# Patient Record
Sex: Female | Born: 1985 | Race: White | Hispanic: No | Marital: Married | Smoking: Never smoker
Health system: Southern US, Community
[De-identification: ages and names within clinical notes are randomized; demographics above are authoritative.]

## PROBLEM LIST (undated history)

## (undated) DIAGNOSIS — M6208 Separation of muscle (nontraumatic), other site: Secondary | ICD-10-CM

## (undated) DIAGNOSIS — Z789 Other specified health status: Secondary | ICD-10-CM

## (undated) HISTORY — DX: Other specified health status: Z78.9

## (undated) HISTORY — PX: NO PAST SURGERIES: SHX2092

## (undated) HISTORY — PX: WISDOM TOOTH EXTRACTION: SHX21

## (undated) HISTORY — DX: Separation of muscle (nontraumatic), other site: M62.08

---

## 2018-08-25 ENCOUNTER — Ambulatory Visit (INDEPENDENT_AMBULATORY_CARE_PROVIDER_SITE_OTHER): Payer: BLUE CROSS/BLUE SHIELD

## 2018-08-25 ENCOUNTER — Other Ambulatory Visit: Payer: Self-pay

## 2018-08-25 ENCOUNTER — Ambulatory Visit (HOSPITAL_COMMUNITY)
Admission: EM | Admit: 2018-08-25 | Discharge: 2018-08-25 | Disposition: A | Discharge status: Home / Self Care | Payer: BLUE CROSS/BLUE SHIELD | Attending: Family Medicine | Admitting: Family Medicine

## 2018-08-25 ENCOUNTER — Encounter (HOSPITAL_COMMUNITY): Payer: Self-pay | Admitting: Emergency Medicine

## 2018-08-25 DIAGNOSIS — B9789 Other viral agents as the cause of diseases classified elsewhere: Secondary | ICD-10-CM

## 2018-08-25 DIAGNOSIS — J069 Acute upper respiratory infection, unspecified: Secondary | ICD-10-CM | POA: Diagnosis not present

## 2018-08-25 DIAGNOSIS — R05 Cough: Secondary | ICD-10-CM | POA: Diagnosis not present

## 2018-08-25 DIAGNOSIS — R509 Fever, unspecified: Secondary | ICD-10-CM

## 2018-08-25 IMAGING — DX CHEST - 2 VIEW
2 series · 2 of 2 positions shown · non-contrast
Comparison: None.

CLINICAL DATA: Cough and fever

EXAM:
CHEST - 2 VIEW

[chest pa]
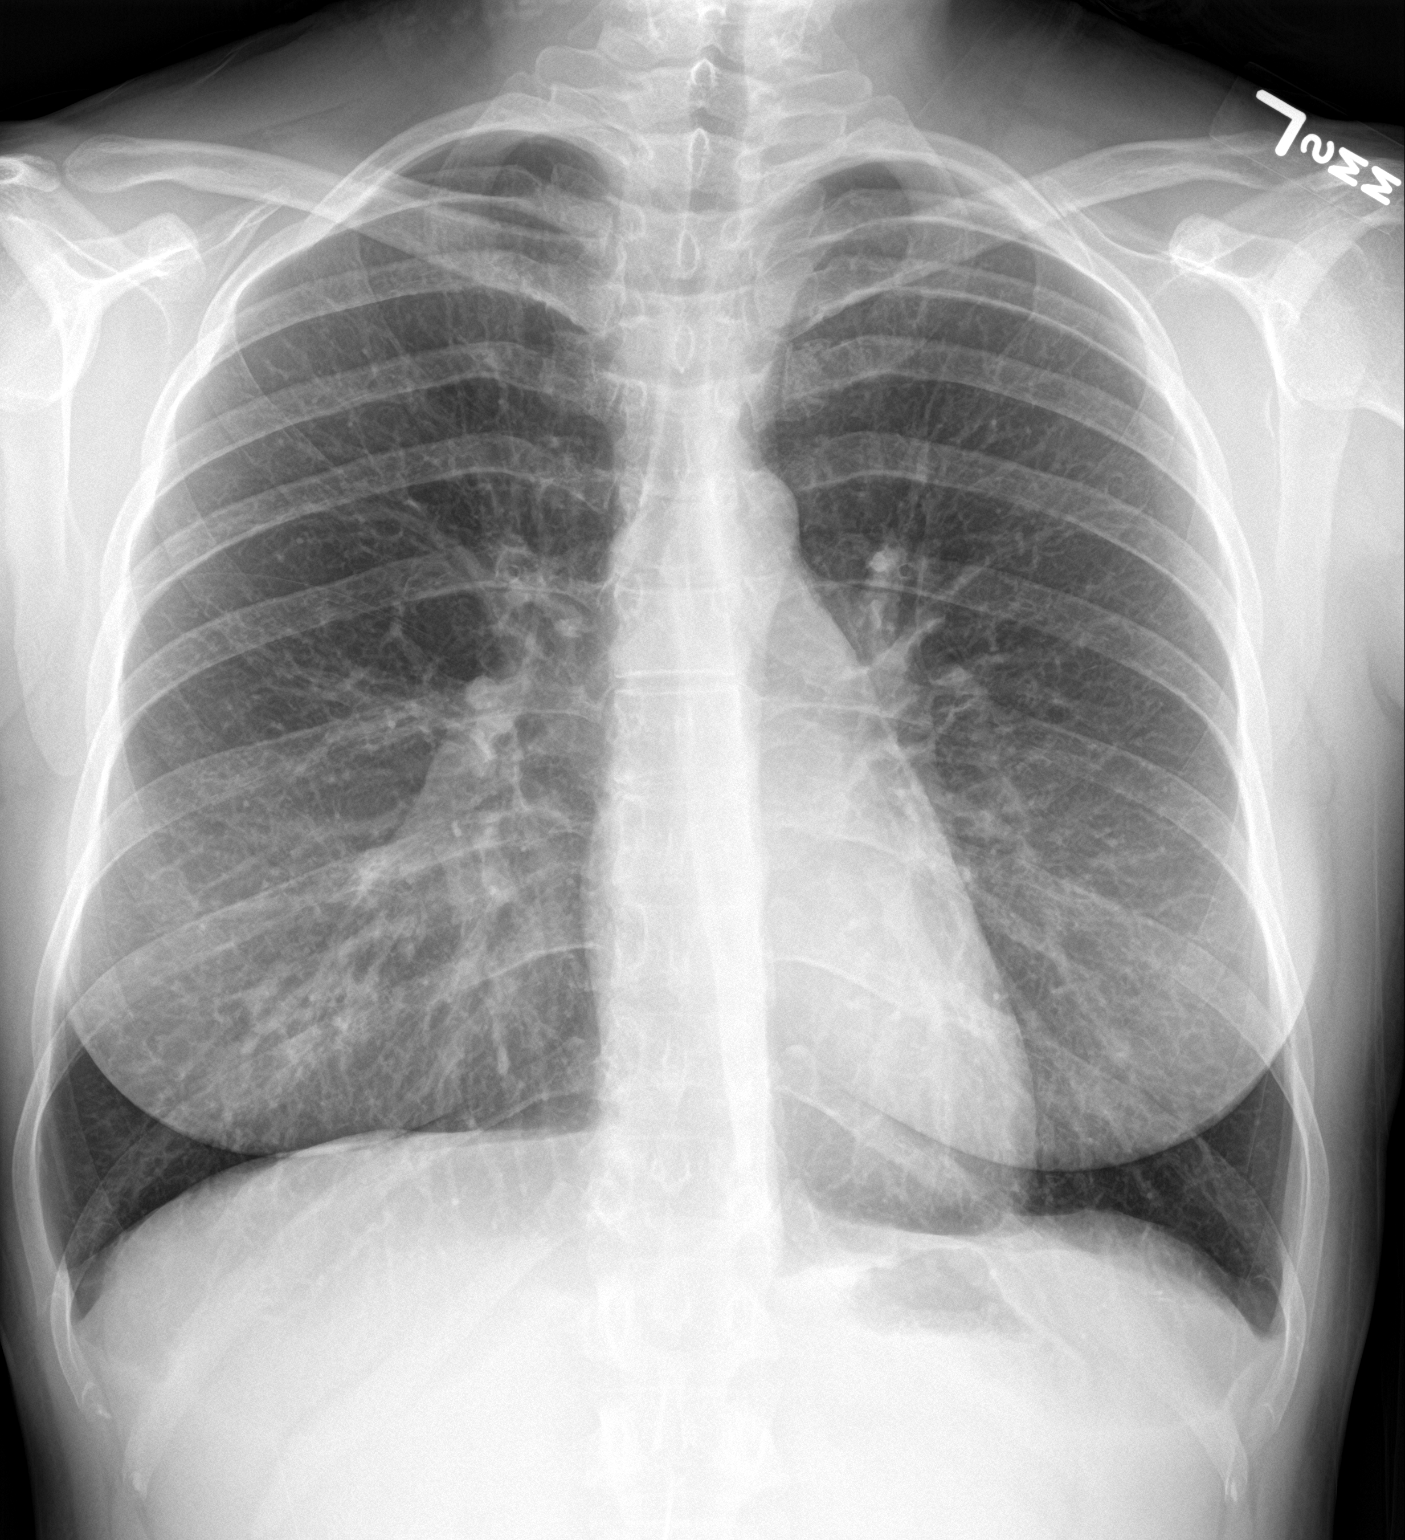

[chest lat]
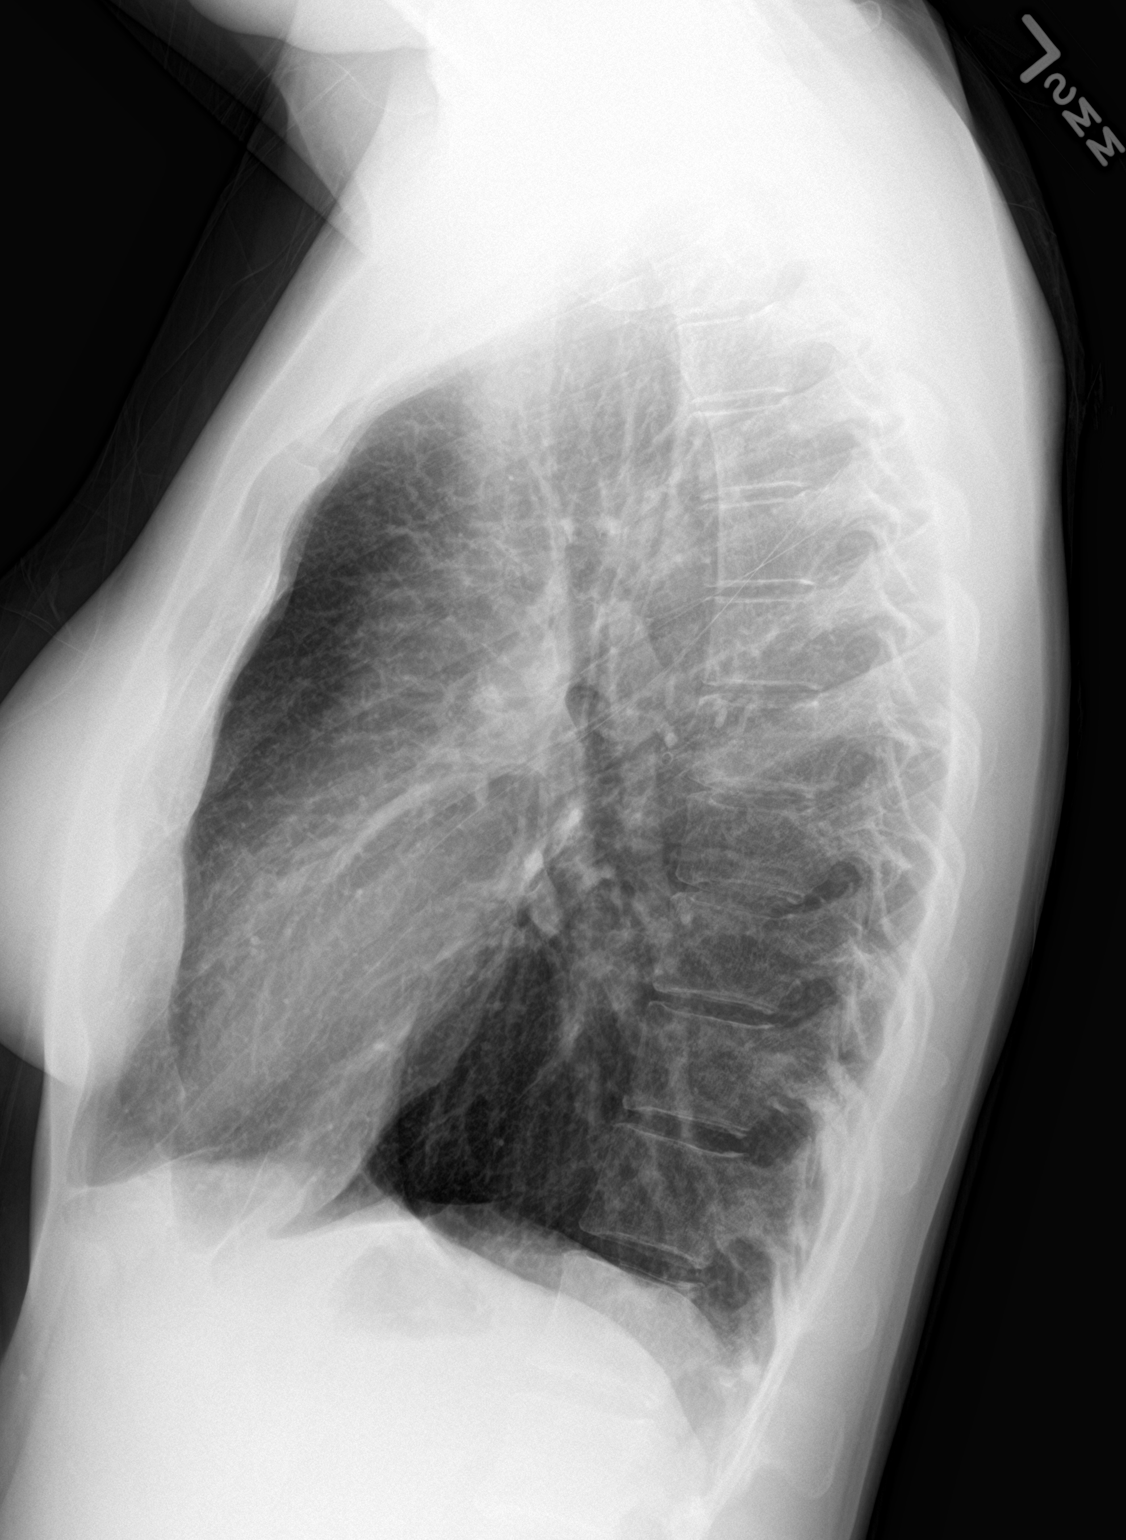

[2 of 2 positions shown; findings below may reference images not displayed]

FINDINGS: Lungs are clear. Heart size and pulmonary vascularity are normal. No
adenopathy. No bone lesions.
IMPRESSION: No edema or consolidation.

## 2018-08-25 MED ORDER — DM-GUAIFENESIN ER 30-600 MG PO TB12: 1.0000 | ORAL_TABLET | Freq: Two times a day (BID) | ORAL | 0 refills | Status: DC

## 2018-08-25 MED ORDER — BENZONATATE 200 MG PO CAPS: 200.0000 mg | ORAL_CAPSULE | Freq: Two times a day (BID) | ORAL | 0 refills | Status: DC | PRN

## 2018-08-25 NOTE — ED Triage Notes (Signed)
3/21 noticed congestion in chest and cough.  Today having coughing episodes.  Patient feels tired.  Patient has rattling in chest

## 2018-08-25 NOTE — ED Notes (Signed)
Patient verbalizes understanding of discharge instructions. Opportunity for questioning and answers were provided. Patient discharged from [facility] by APP.  

## 2018-08-25 NOTE — ED Provider Notes (Signed)
[facility]    CSN: [id number] Arrival date & time: 08/25/18  1236     History   Chief Complaint Chief Complaint  Patient presents with  . Cough    HPI Mary Baldwin is a 33 y.o. female.   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  Because of the coronavirus, she is working from home.  She and her husband came to [place] with their 2 children in order to have access to childcare.  1 of her daughters has had a fever recently with respiratory illness.  Her husband had a respiratory illness and was treated with a penicillin.  Her mother had a respiratory illness and was treated with a Z-Pak.  This is all been within the last 2 to 3 weeks.  She has been in [place] for the last few days.  She has been coughing for 2 days, has a lot of congestion, feels like her lungs are "rattling" with a cough.  No shortness of breath.  Has not noted any sweats chills or fever.  She states she feels very tired. No known exposure to a direct person with coronavirus.  Claris Gower has the highest community transmission in the state of [place].  History reviewed. No pertinent past medical history.  There are no active problems to display for this patient.   History reviewed. No pertinent surgical history.  OB History   No obstetric history on file.      Home Medications    Prior to Admission medications   Medication Sig Start Date End Date Taking? Authorizing Provider  benzonatate (TESSALON) 200 MG capsule Take 1 capsule (200 mg total) by mouth 2 (two) times daily as needed for cough. 08/25/18   Eustace Moore, MD  dextromethorphan-guaiFENesin [facility] DM) 30-600 MG 12hr tablet Take 1 tablet by mouth 2 (two) times daily. 08/25/18   Eustace Moore, MD    Family History Family History  Problem Relation Age of Onset  . Healthy Mother     Social History Social History   Tobacco Use  . Smoking status: Never Smoker  Substance Use Topics   . Alcohol use: Not Currently  . Drug use: Never     Allergies   Patient has no known allergies.   Review of Systems Review of Systems  Constitutional: Positive for fatigue. Negative for chills and fever.  HENT: Negative for congestion, ear pain and sore throat.   Eyes: Negative for pain and visual disturbance.  Respiratory: Positive for cough. Negative for shortness of breath.   Cardiovascular: Negative for chest pain and palpitations.  Gastrointestinal: Negative for abdominal pain and vomiting.  Genitourinary: Negative for dysuria and hematuria.  Musculoskeletal: Negative for arthralgias and back pain.  Skin: Negative for color change and rash.  Neurological: Negative for seizures and syncope.  All other systems reviewed and are negative.    Physical Exam Triage Vital Signs ED Triage Vitals  Enc Vitals Group     BP 08/25/18 1310 131/79     Pulse Rate 08/25/18 1310 (!) 106     Resp 08/25/18 1310 18     Temp 08/25/18 1310 98.4 F (36.9 C)     Temp Source 08/25/18 1310 Oral     SpO2 08/25/18 1310 100 %     Weight --      Height --      Head Circumference --      Peak Flow --  Pain Score 08/25/18 1305 4     Pain Loc --      Pain Edu? --      Excl. in GC? --    No data found.  Updated Vital Signs BP 131/79 (BP Location: Right Arm)   Pulse (!) 106   Temp 98.4 F (36.9 C) (Oral)   Resp 18   LMP 12/24/2016   SpO2 100%     Physical Exam Constitutional:      Appearance: She is well-developed. She is not ill-appearing.  HENT:     Head: Normocephalic and atraumatic.     Right Ear: Tympanic membrane, ear canal and external ear normal.     Left Ear: Tympanic membrane, ear canal and external ear normal.     Nose: Nose normal.     Mouth/Throat:     Mouth: Mucous membranes are moist.     Pharynx: No posterior oropharyngeal erythema.  Eyes:     Conjunctiva/sclera: Conjunctivae normal.     Pupils: Pupils are equal, round, and reactive to light.  Neck:      Musculoskeletal: Normal range of motion and neck supple.  Cardiovascular:     Rate and Rhythm: Normal rate and regular rhythm.     Heart sounds: Normal heart sounds.  Pulmonary:     Effort: Pulmonary effort is normal. No respiratory distress.     Breath sounds: Rhonchi present.     Comments: Course rhonchi throughout both lung fields, right greater than left Abdominal:     General: There is no distension.     Palpations: Abdomen is soft.  Musculoskeletal: Normal range of motion.  Skin:    General: Skin is warm and dry.  Neurological:     General: No focal deficit present.     Mental Status: She is alert and oriented to person, place, and time.  Psychiatric:        Mood and Affect: Mood normal.        Behavior: Behavior normal.      UC Treatments / Results  Labs (all labs ordered are listed, but only abnormal results are displayed) Labs Reviewed - No data to display  EKG None  Radiology Dg Chest 2 View  Result Date: 08/25/2018 CLINICAL DATA:  Cough and fever EXAM: CHEST - 2 VIEW COMPARISON:  None. FINDINGS: Lungs are clear. Heart size and pulmonary vascularity are normal. No adenopathy. No bone lesions. IMPRESSION: No edema or consolidation. Electronically Signed   By: Bretta BangWilliam  Woodruff III M.D.   On: 08/25/2018 14:51    Procedures Procedures (including critical care time)  Medications Ordered in UC Medications - No data to display  Initial Impression / Assessment and Plan / UC Course  I have reviewed the triage vital signs and the nursing notes.  Pertinent labs & imaging results that were available during my care of the patient were reviewed by me and considered in my medical decision making (see chart for details).    I had a discussion with the patient that there is no way to determine whether she has the novel coronavirus.  The safest approach is to assume she does have, and to quarantine.  She is breast-feeding her 3326-month-old infant.  I told her not to stop to  be sure she practices good handwashing and wears a mask while feeding the baby.  Permission was given to the patient from the [facility] [place] [facility] and [employer].  Final Clinical Impressions(s) / UC Diagnoses   Final diagnoses:  Viral URI with cough     Discharge Instructions     Drink plenty of fluids Use humidifier if you have one Take both Mucinex DM and Tessalon every 12 hours for cough In between doses you can try a spoonful of honey  Testing is not available for the coronavirus unless a person is critically ill or in the hospital.  There exists the possibility that you could have coronavirus at this time.  Anne Shutter is recommended until you are symptom-free.  Please read information given to you    ED Prescriptions    Medication Sig Dispense Auth. Provider   benzonatate (TESSALON) 200 MG capsule Take 1 capsule (200 mg total) by mouth 2 (two) times daily as needed for cough. 20 capsule Eustace Moore, MD   dextromethorphan-guaiFENesin [facility] DM) 30-600 MG 12hr tablet Take 1 tablet by mouth 2 (two) times daily. 20 tablet Eustace Moore, MD     Controlled Substance Prescriptions [place] Controlled Substance Registry consulted? No   Eustace Moore, MD 08/25/18 [phone]

## 2018-08-25 NOTE — Discharge Instructions (Addendum)
Drink plenty of fluids Use humidifier if you have one Take both Mucinex DM and Tessalon every 12 hours for cough In between doses you can try a spoonful of honey  Testing is not available for the coronavirus unless a person is critically ill or in the hospital.  There exists the possibility that you could have coronavirus at this time.  Anne Shutter is recommended until you are symptom-free.  Please read information given to you

## 2020-02-16 LAB — OB RESULTS CONSOLE HIV ANTIBODY (ROUTINE TESTING): HIV: NONREACTIVE

## 2020-02-16 LAB — OB RESULTS CONSOLE HEPATITIS B SURFACE ANTIGEN: Hepatitis B Surface Ag: NEGATIVE

## 2020-02-16 LAB — OB RESULTS CONSOLE RPR: RPR: NONREACTIVE

## 2020-02-16 LAB — OB RESULTS CONSOLE GC/CHLAMYDIA
Chlamydia: NEGATIVE
Gonorrhea: NEGATIVE

## 2020-02-16 LAB — OB RESULTS CONSOLE RUBELLA ANTIBODY, IGM: Rubella: IMMUNE

## 2020-03-07 ENCOUNTER — Other Ambulatory Visit: Payer: BLUE CROSS/BLUE SHIELD

## 2020-03-07 DIAGNOSIS — Z20822 Contact with and (suspected) exposure to covid-19: Secondary | ICD-10-CM

## 2020-03-08 LAB — SARS-COV-2, NAA 2 DAY TAT

## 2020-03-08 LAB — NOVEL CORONAVIRUS, NAA: SARS-CoV-2, NAA: NOT DETECTED

## 2020-03-09 ENCOUNTER — Telehealth: Payer: Self-pay

## 2020-03-09 NOTE — Telephone Encounter (Signed)
Naleah called to get COVID results.  I made her aware result is not detected/negative.  Lenda Kelp, RN

## 2020-03-17 DIAGNOSIS — H6123 Impacted cerumen, bilateral: Secondary | ICD-10-CM | POA: Insufficient documentation

## 2020-06-04 NOTE — L&D Delivery Note (Signed)
Delivery Note:   G3P1001 at [redacted]w[redacted]d  Admitting diagnosis: Pregnancy [Z34.90] Risks: Polyhydramnios Onset of labor: 09/09/2020 0830 IOL/Augmentation: AROM, Pitocin and Cytotec ROM: 09/09/2020 1607, clear fluid, copious amount  Complete dilation at 09/09/2020  2234 Onset of pushing at 2235 FHR second stage Cat I  Analgesia /Anesthesia intrapartum:None  Pushing in right side lying position with CNM and L&D staff support, husband, Lafayette Dragon, present for birth and supportive.  Delivery of a Live born female  Birth Weight:  3581g, 7lb 14.3oz APGAR: 9, 9  Newborn Delivery   Birth date/time: 09/09/2020 22:45:00 Delivery type: Vaginal, Spontaneous      in cephalic presentation, position OA to LOA.  APGAR:1 min-9 , 5 min-9   Nuchal Cord: No  Cord double clamped after cessation of pulsation, cut by Lafayette Dragon.  Collection of cord blood for typing completed. Cord blood donation-None  Arterial cord blood sample-No    Placenta delivered-Spontaneous  with 3 vessels . Uterotonics: Pitocin Placenta to L&D Uterine tone firm  Bleeding stable  Initially brisk with placenta delivery. Pitocin started and fundal massage performed. Firm and bleeding small.   None  laceration identified.  Episiotomy:None  Local analgesia: N/A  Repair: N/A Est. Blood Loss (mL):600.00   Complications: None  Mom to postpartum.  Baby Cornelius [place] to Couplet care / Skin to Skin.  Delivery Report:  Review the Delivery Report for details.    June Leap, CNM, MSN 09/09/2020, 11:48 PM

## 2020-08-29 ENCOUNTER — Telehealth (HOSPITAL_COMMUNITY): Payer: Self-pay | Admitting: *Deleted

## 2020-08-29 ENCOUNTER — Encounter (HOSPITAL_COMMUNITY): Payer: Self-pay | Admitting: *Deleted

## 2020-08-29 LAB — OB RESULTS CONSOLE GBS: GBS: NEGATIVE

## 2020-08-29 NOTE — Telephone Encounter (Signed)
Preadmission screen  

## 2020-09-02 ENCOUNTER — Encounter (HOSPITAL_COMMUNITY): Payer: Self-pay | Admitting: Obstetrics

## 2020-09-02 ENCOUNTER — Other Ambulatory Visit: Payer: Self-pay

## 2020-09-02 ENCOUNTER — Inpatient Hospital Stay (HOSPITAL_BASED_OUTPATIENT_CLINIC_OR_DEPARTMENT_OTHER): Payer: BLUE CROSS/BLUE SHIELD

## 2020-09-02 ENCOUNTER — Encounter (HOSPITAL_COMMUNITY): Payer: Self-pay | Admitting: *Deleted

## 2020-09-02 ENCOUNTER — Inpatient Hospital Stay (HOSPITAL_COMMUNITY)
Admission: AD | Admit: 2020-09-02 | Discharge: 2020-09-02 | Disposition: A | Discharge status: Home / Self Care | Payer: BLUE CROSS/BLUE SHIELD | Attending: Obstetrics | Admitting: Obstetrics

## 2020-09-02 ENCOUNTER — Telehealth (HOSPITAL_COMMUNITY): Payer: Self-pay | Admitting: *Deleted

## 2020-09-02 DIAGNOSIS — Z3A38 38 weeks gestation of pregnancy: Secondary | ICD-10-CM | POA: Diagnosis not present

## 2020-09-02 DIAGNOSIS — O9A213 Injury, poisoning and certain other consequences of external causes complicating pregnancy, third trimester: Secondary | ICD-10-CM

## 2020-09-02 DIAGNOSIS — Z9181 History of falling: Secondary | ICD-10-CM

## 2020-09-02 DIAGNOSIS — T1490XA Injury, unspecified, initial encounter: Secondary | ICD-10-CM

## 2020-09-02 DIAGNOSIS — O99891 Other specified diseases and conditions complicating pregnancy: Secondary | ICD-10-CM | POA: Diagnosis not present

## 2020-09-02 DIAGNOSIS — W010XXA Fall on same level from slipping, tripping and stumbling without subsequent striking against object, initial encounter: Secondary | ICD-10-CM | POA: Diagnosis not present

## 2020-09-02 DIAGNOSIS — O26893 Other specified pregnancy related conditions, third trimester: Secondary | ICD-10-CM | POA: Diagnosis present

## 2020-09-02 DIAGNOSIS — Z3689 Encounter for other specified antenatal screening: Secondary | ICD-10-CM

## 2020-09-02 DIAGNOSIS — W19XXXA Unspecified fall, initial encounter: Secondary | ICD-10-CM | POA: Diagnosis not present

## 2020-09-02 LAB — URINALYSIS, ROUTINE W REFLEX MICROSCOPIC
Bilirubin Urine: NEGATIVE
Glucose, UA: NEGATIVE mg/dL
Hgb urine dipstick: NEGATIVE
Ketones, ur: 5 mg/dL — AB
Leukocytes,Ua: NEGATIVE
Nitrite: NEGATIVE
Protein, ur: NEGATIVE mg/dL
Specific Gravity, Urine: 1.005 (ref 1.005–1.030)
pH: 7 (ref 5.0–8.0)

## 2020-09-02 NOTE — Discharge Instructions (Signed)
Fall Prevention in the Home, Adult Falls can cause injuries and can affect people from all age groups. There are many simple things that you can do to make your home safe and to help prevent falls. Ask for help when making these changes, if needed. What actions can I take to prevent falls? General instructions  Use good lighting in all rooms. Replace any light bulbs that burn out.  Turn on lights if it is dark. Use night-lights.  Place frequently used items in easy-to-reach places. Lower the shelves around your home if necessary.  Set up furniture so that there are clear paths around it. Avoid moving your furniture around.  Remove throw rugs and other tripping hazards from the floor.  Avoid walking on wet floors.  Fix any uneven floor surfaces.  Add color or contrast paint or tape to grab bars and handrails in your home. Place contrasting color strips on the first and last steps of stairways.  When you use a stepladder, make sure that it is completely opened and that the sides are firmly locked. Have someone hold the ladder while you are using it. Do not climb a closed stepladder.  Be aware of any and all pets. What can I do in the bathroom?  Keep the floor dry. Immediately clean up any water that spills onto the floor.  Remove soap buildup in the tub or shower on a regular basis.  Use non-skid mats or decals on the floor of the tub or shower.  Attach bath mats securely with double-sided, non-slip rug tape.  If you need to sit down while you are in the shower, use a plastic, non-slip stool.  Install grab bars by the toilet and in the tub and shower. Do not use towel bars as grab bars.      What can I do in the bedroom?  Make sure that a bedside light is easy to reach.  Do not use oversized bedding that drapes onto the floor.  Have a firm chair that has side arms to use for getting dressed. What can I do in the kitchen?  Clean up any spills right away.  If you need to  reach for something above you, use a sturdy step stool that has a grab bar.  Keep electrical cables out of the way.  Do not use floor polish or wax that makes floors slippery. If you must use wax, make sure that it is non-skid floor wax. What can I do in the stairways?  Do not leave any items on the stairs.  Make sure that you have a light switch at the top of the stairs and the bottom of the stairs. Have them installed if you do not have them.  Make sure that there are handrails on both sides of the stairs. Fix handrails that are broken or loose. Make sure that handrails are as long as the stairways.  Install non-slip stair treads on all stairs in your home.  Avoid having throw rugs at the top or bottom of stairways, or secure the rugs with carpet tape to prevent them from moving.  Choose a carpet design that does not hide the edge of steps on the stairway.  Check any carpeting to make sure that it is firmly attached to the stairs. Fix any carpet that is loose or worn. What can I do on the outside of my home?  Use bright outdoor lighting.  Regularly repair the edges of walkways and driveways and fix any cracks.  Remove high doorway thresholds.  Trim any shrubbery on the main path into your home.  Regularly check that handrails are securely fastened and in good repair. Both sides of any steps should have handrails.  Install guardrails along the edges of any raised decks or porches.  Clear walkways of debris and clutter, including tools and rocks.  Have leaves, snow, and ice cleared regularly.  Use sand or salt on walkways during winter months.  In the garage, clean up any spills right away, including grease or oil spills. What other actions can I take?  Wear closed-toe shoes that fit well and support your feet. Wear shoes that have rubber soles or low heels.  Use mobility aids as needed, such as canes, walkers, scooters, and crutches.  Review your medicines with your  health care provider. Some medicines can cause dizziness or changes in blood pressure, which increase your risk of falling. Talk with your health care provider about other ways that you can decrease your risk of falls. This may include working with a physical therapist or trainer to improve your strength, balance, and endurance. Where to find more information  Centers for Disease Control and Prevention, STEADI: TVDivision.uy  [employer] on Aging: RingConnections.si Contact a health care provider if:  You are afraid of falling at home.  You feel weak, drowsy, or dizzy at home.  You fall at home. Summary  There are many simple things that you can do to make your home safe and to help prevent falls.  Ways to make your home safe include removing tripping hazards and installing grab bars in the bathroom.  Ask for help when making these changes in your home. This information is not intended to replace advice given to you by your health care provider. Make sure you discuss any questions you have with your health care provider. Document Revised: 05/03/2017 Document Reviewed: 01/03/2017 Elsevier Patient Education  2021 Elsevier Inc.  Fetal Movement Counts Patient Name: ________________________________________________ Patient Due Date: ____________________  What is a fetal movement count? A fetal movement count is the number of times that you feel your baby move during a certain amount of time. This may also be called a fetal kick count. A fetal movement count is recommended for every pregnant woman. You may be asked to start counting fetal movements as early as week 28 of your pregnancy. Pay attention to when your baby is most active. You may notice your baby's sleep and wake cycles. You may also notice things that make your baby move more. You should do a fetal movement count:  When your baby is normally most active.  At the same time each day. A good time to count  movements is while you are resting, after having something to eat and drink. How do I count fetal movements? 1. Find a quiet, comfortable area. Sit, or lie down on your side. 2. Write down the date, the start time and stop time, and the number of movements that you felt between those two times. Take this information with you to your health care visits. 3. Write down your start time when you feel the first movement. 4. Count kicks, flutters, swishes, rolls, and jabs. You should feel at least 10 movements. 5. You may stop counting after you have felt 10 movements, or if you have been counting for 2 hours. Write down the stop time. 6. If you do not feel 10 movements in 2 hours, contact your health care provider for further instructions. Your health  care provider may want to do additional tests to assess your baby's well-being. Contact a health care provider if:  You feel fewer than 10 movements in 2 hours.  Your baby is not moving like he or she usually does. Date: ____________ Start time: ____________ Stop time: ____________ Movements: ____________ Date: ____________ Start time: ____________ Stop time: ____________ Movements: ____________ Date: ____________ Start time: ____________ Stop time: ____________ Movements: ____________ Date: ____________ Start time: ____________ Stop time: ____________ Movements: ____________ Date: ____________ Start time: ____________ Stop time: ____________ Movements: ____________ Date: ____________ Start time: ____________ Stop time: ____________ Movements: ____________ Date: ____________ Start time: ____________ Stop time: ____________ Movements: ____________ Date: ____________ Start time: ____________ Stop time: ____________ Movements: ____________ Date: ____________ Start time: ____________ Stop time: ____________ Movements: ____________ This information is not intended to replace advice given to you by your health care provider. Make sure you discuss any  questions you have with your health care provider. Document Revised: 01/08/2019 Document Reviewed: 01/08/2019 Elsevier Patient Education  2021 [employer].

## 2020-09-02 NOTE — Telephone Encounter (Signed)
Preadmission screen  

## 2020-09-02 NOTE — MAU Provider Note (Signed)
S Ms. Mary Baldwin is a 35 y.o. G2P1 pregnant female at [redacted]w[redacted]d who presents to MAU today after a fall at 76. She was holding her older child, tripped and fell to the ground, hitting her knees first then elbow and wrist, then her belly but states she did not hit her belly hard as her knees took the brunt of the fall. Initially had some cramping which has resolved, denies vaginal bleeding. Having BH that are consistent with what she's had every night for a few weeks. Rates them as tight but not painful.   O BP 115/65 (BP Location: Left Arm)   Pulse 72   Temp 98.2 F (36.8 C) (Oral)   Resp 16   Ht 5\' 5"  (1.651 m)   Wt 175 lb 4.8 oz (79.5 kg)   SpO2 100%   BMI 29.17 kg/m  Physical Exam Vitals and nursing note reviewed.  Constitutional:      General: She is not in acute distress.    Appearance: Normal appearance. She is not ill-appearing.  HENT:     Head: Normocephalic and atraumatic.     Mouth/Throat:     Mouth: Mucous membranes are moist.  Eyes:     Pupils: Pupils are equal, round, and reactive to light.  Cardiovascular:     Rate and Rhythm: Normal rate and regular rhythm.     Pulses: Normal pulses.  Pulmonary:     Effort: Pulmonary effort is normal.  Abdominal:     General: There is no distension.     Palpations: Abdomen is soft. There is no mass.     Tenderness: There is no abdominal tenderness. There is no guarding.  Genitourinary:    Comments: Pt declined cervical exam Musculoskeletal:        General: Signs of injury (abrasion to left elbow and right wrist) present. Normal range of motion.     Cervical back: Normal range of motion.  Skin:    General: Skin is warm and dry.     Capillary Refill: Capillary refill takes less than 2 seconds.  Neurological:     Mental Status: She is alert and oriented to person, place, and time.  Psychiatric:        Mood and Affect: Mood normal.        Behavior: Behavior normal.        Thought Content: Thought content normal.         Judgment: Judgment normal.   Fetal Tracing: reactive Baseline: 135 Variability: moderate Accelerations: 15x15 Decelerations: none (initially had a couple of variables, none since beginning of MAU observation) Toco: q2-69min pt continued to rate them as mild tightness, not cramping    Reviewed presentation and findings with Drs. [facility] attending) and [facility]. [facility]) who agreed 4hrs post-fall is enough monitoring given her absence of pain or bleeding + reactive NST.  A Recent history of fall NST reactive  P Discharge from MAU in stable condition with term labor precautions Follow up as scheduled at [facility] for ongoing prenatal care  [facility], CNM 09/02/2020 9:21 PM

## 2020-09-02 NOTE — MAU Note (Signed)
Shantrell Placzek is a 35 y.o. at [redacted]w[redacted]d here in MAU reporting: states she fell today when she was picking her daughter up at school. Pt reports she hit her arm and her abdomen. Fall occurred around 1715. Had some cramping initially but that has gone away. No bleeding or LOF. +FM  Onset of complaint: today  Pain score: 0/10  Vitals:   09/02/20 1814 09/02/20 1825  BP: 123/76 123/73  Pulse: 80 74  Resp: 16   Temp: 98.2 F (36.8 C)   SpO2: 100% 99%     FHT: FHT dopplered while in triage, ? decel heard on doppler so pt brought directly to room 126 where EFM was applied, FHT 137 when applied  Lab orders placed from triage: none

## 2020-09-05 ENCOUNTER — Encounter (HOSPITAL_COMMUNITY): Payer: Self-pay | Admitting: Obstetrics

## 2020-09-07 ENCOUNTER — Other Ambulatory Visit (HOSPITAL_COMMUNITY): Admission: RE | Admit: 2020-09-07 | Payer: BLUE CROSS/BLUE SHIELD | Source: Ambulatory Visit

## 2020-09-07 ENCOUNTER — Other Ambulatory Visit (HOSPITAL_COMMUNITY)
Admission: RE | Admit: 2020-09-07 | Discharge: 2020-09-07 | Disposition: A | Discharge status: Home / Self Care | Payer: BLUE CROSS/BLUE SHIELD | Source: Ambulatory Visit | Attending: Obstetrics | Admitting: Obstetrics

## 2020-09-07 DIAGNOSIS — Z20822 Contact with and (suspected) exposure to covid-19: Secondary | ICD-10-CM | POA: Insufficient documentation

## 2020-09-07 DIAGNOSIS — Z01812 Encounter for preprocedural laboratory examination: Secondary | ICD-10-CM | POA: Insufficient documentation

## 2020-09-07 LAB — SARS CORONAVIRUS 2 (TAT 6-24 HRS): SARS Coronavirus 2: NEGATIVE

## 2020-09-08 ENCOUNTER — Other Ambulatory Visit: Payer: Self-pay | Admitting: Certified Nurse Midwife

## 2020-09-09 ENCOUNTER — Inpatient Hospital Stay (HOSPITAL_COMMUNITY)
Admission: AD | Admit: 2020-09-09 | Discharge: 2020-09-11 | DRG: 807 | Disposition: A | Discharge status: Home / Self Care | Payer: BLUE CROSS/BLUE SHIELD | Attending: Obstetrics and Gynecology | Admitting: Obstetrics and Gynecology

## 2020-09-09 ENCOUNTER — Inpatient Hospital Stay (HOSPITAL_COMMUNITY): Payer: BLUE CROSS/BLUE SHIELD

## 2020-09-09 ENCOUNTER — Inpatient Hospital Stay (HOSPITAL_COMMUNITY): Admission: AD | Admit: 2020-09-09 | Payer: BLUE CROSS/BLUE SHIELD | Source: Home / Self Care | Admitting: Obstetrics

## 2020-09-09 ENCOUNTER — Encounter (HOSPITAL_COMMUNITY): Payer: Self-pay | Admitting: Obstetrics

## 2020-09-09 ENCOUNTER — Other Ambulatory Visit: Payer: Self-pay

## 2020-09-09 DIAGNOSIS — M6208 Separation of muscle (nontraumatic), other site: Secondary | ICD-10-CM | POA: Diagnosis present

## 2020-09-09 DIAGNOSIS — Z20822 Contact with and (suspected) exposure to covid-19: Secondary | ICD-10-CM | POA: Diagnosis present

## 2020-09-09 DIAGNOSIS — O403XX0 Polyhydramnios, third trimester, not applicable or unspecified: Principal | ICD-10-CM | POA: Diagnosis present

## 2020-09-09 DIAGNOSIS — Z3A39 39 weeks gestation of pregnancy: Secondary | ICD-10-CM

## 2020-09-09 DIAGNOSIS — O409XX0 Polyhydramnios, unspecified trimester, not applicable or unspecified: Secondary | ICD-10-CM | POA: Diagnosis present

## 2020-09-09 DIAGNOSIS — Z3490 Encounter for supervision of normal pregnancy, unspecified, unspecified trimester: Secondary | ICD-10-CM

## 2020-09-09 LAB — CBC
HCT: 38.5 % (ref 36.0–46.0)
Hemoglobin: 13.1 g/dL (ref 12.0–15.0)
MCH: 32.6 pg (ref 26.0–34.0)
MCHC: 34 g/dL (ref 30.0–36.0)
MCV: 95.8 fL (ref 80.0–100.0)
Platelets: 134 10*3/uL — ABNORMAL LOW (ref 150–400)
RBC: 4.02 MIL/uL (ref 3.87–5.11)
RDW: 13 % (ref 11.5–15.5)
WBC: 8.1 10*3/uL (ref 4.0–10.5)
nRBC: 0 % (ref 0.0–0.2)

## 2020-09-09 LAB — RPR: RPR Ser Ql: NONREACTIVE

## 2020-09-09 LAB — TYPE AND SCREEN
ABO/RH(D): A POS
Antibody Screen: NEGATIVE

## 2020-09-09 MED ORDER — MISOPROSTOL 50MCG HALF TABLET
50.0000 ug | ORAL_TABLET | ORAL | Status: DC
  Administered 2020-09-09: 50 ug via BUCCAL

## 2020-09-09 MED ORDER — LIDOCAINE HCL (PF) 1 % IJ SOLN: 30.0000 mL | INTRAMUSCULAR | Status: DC | PRN

## 2020-09-09 MED ORDER — OXYTOCIN-SODIUM CHLORIDE 30-0.9 UT/500ML-% IV SOLN
1.0000 m[IU]/min | INTRAVENOUS | Status: DC
  Administered 2020-09-09: 2 m[IU]/min via INTRAVENOUS

## 2020-09-09 MED ORDER — OXYCODONE-ACETAMINOPHEN 5-325 MG PO TABS: 2.0000 | ORAL_TABLET | ORAL | Status: DC | PRN

## 2020-09-09 MED ORDER — OXYTOCIN-SODIUM CHLORIDE 30-0.9 UT/500ML-% IV SOLN
2.5000 [IU]/h | INTRAVENOUS | Status: DC
  Filled 2020-09-09: qty 500

## 2020-09-09 MED ORDER — MISOPROSTOL 50MCG HALF TABLET
ORAL_TABLET | ORAL | Status: AC
  Filled 2020-09-09: qty 1

## 2020-09-09 MED ORDER — OXYCODONE-ACETAMINOPHEN 5-325 MG PO TABS
1.0000 | ORAL_TABLET | ORAL | Status: DC | PRN
Start: 2020-09-09 — End: 2020-09-10

## 2020-09-09 MED ORDER — SOD CITRATE-CITRIC ACID 500-334 MG/5ML PO SOLN: 30.0000 mL | ORAL | Status: DC | PRN

## 2020-09-09 MED ORDER — MISOPROSTOL 50MCG HALF TABLET
50.0000 ug | ORAL_TABLET | ORAL | Status: DC | PRN
  Administered 2020-09-09: 50 ug via BUCCAL
  Filled 2020-09-09: qty 1

## 2020-09-09 MED ORDER — LACTATED RINGERS IV SOLN: INTRAVENOUS | Status: DC

## 2020-09-09 MED ORDER — ONDANSETRON HCL 4 MG/2ML IJ SOLN: 4.0000 mg | Freq: Four times a day (QID) | INTRAMUSCULAR | Status: DC | PRN

## 2020-09-09 MED ORDER — TERBUTALINE SULFATE 1 MG/ML IJ SOLN: 0.2500 mg | Freq: Once | INTRAMUSCULAR | Status: DC | PRN

## 2020-09-09 MED ORDER — ACETAMINOPHEN 325 MG PO TABS: 650.0000 mg | ORAL_TABLET | ORAL | Status: DC | PRN

## 2020-09-09 MED ORDER — LACTATED RINGERS IV SOLN
500.0000 mL | INTRAVENOUS | Status: DC | PRN
Start: 2020-09-09 — End: 2020-09-10

## 2020-09-09 MED ORDER — OXYTOCIN BOLUS FROM INFUSION
333.0000 mL | Freq: Once | INTRAVENOUS | Status: AC
  Administered 2020-09-10: 333 mL via INTRAVENOUS

## 2020-09-09 NOTE — Progress Notes (Signed)
S: Feeling mild contractions while sitting on birth ball. Husband, Lafayette Dragon, at the bedside providing support. Discussed the R/B/A of AROM for labor induction. Patient consents to procedure.   O: Vitals:   09/09/20 1238 09/09/20 1400 09/09/20 1514 09/09/20 1610  BP: 123/65 107/62 133/83 138/79  Pulse: (!) 105 97 (!) 103 (!) 108  Resp: 18 18 18 18   Temp: 98.1 F (36.7 C)  98.2 F (36.8 C)   TempSrc: Oral  Oral   Weight:      Height:       FHT:  FHR: 135 bpm, variability: moderate,  accelerations:  Present,  decelerations:  Absent UC:   regular, every 2-3 minutes SVE:   Dilation: 3 Effacement (%): 70 Station: -2 Exam by:: A Merilynn Haydu CNM   AROM of a large amount of clear fluid at 1607.  A / P: Induction of labor due to polyhydramnios, s/p 2 doses of buccal Cytotec, AROM clear fluid  Fetal Wellbeing:  Category I Pain Control:  Labor support without medications Anticipated MOD:  NSVD  [phone], CNM, MSN 09/09/2020, 5:09 PM

## 2020-09-09 NOTE — Lactation Note (Signed)
This note was copied from a baby's chart. Lactation Consultation Note  Patient Name: Mary Baldwin Today's Date: 09/09/2020   Age:35 hours LC talked with L&D ( RN),  mom declined LC services at this time. Per RN, infant already latched at the breast. Mom is having a  repair procedure done and would like LC services in the morning.   Maternal Data    Feeding    LATCH Score Latch: Grasps breast easily, tongue down, lips flanged, rhythmical sucking.  Audible Swallowing: Spontaneous and intermittent  Type of Nipple: Everted at rest and after stimulation  Comfort (Breast/Nipple): Soft / non-tender  Hold (Positioning): No assistance needed to correctly position infant at breast.  LATCH Score: 10   Lactation Tools Discussed/Used    Interventions    Discharge    Consult Status      Mary Baldwin 09/09/2020, 11:30 PM

## 2020-09-09 NOTE — H&P (Signed)
OB ADMISSION/ HISTORY & PHYSICAL:  Admission Date: 09/09/2020  7:17 AM  Admit Diagnosis: Pregnancy [Z34.90]    Mary Baldwin is a 35 y.o. female [redacted]w[redacted]d presenting for IOL due to mild polyhydramnios. AFI 24.8cm at 38 weeks. Denies contractions, leaking of fluid, or vaginal bleeding. Endorses + fetal movement. Husband, Lafayette Dragon, at the bedside for labor support. Expecting a surprise baby.   Prenatal History: G3P1001   EDC : 09/16/2020 Prenatal care at [facility] since 23 weeks, transfer from [facility] for waterbirth option, primary M. Sigmon CNM  Prenatal course complicated by: 1. Polyhydramnios, last AFI on 4/7 24.8cm 2. Diastasis recti  Prenatal Labs: ABO, Rh:   A POS Antibody: NEG (04/08 0802) Rubella: Immune (09/14 0000)  RPR: NON REACTIVE (04/08 0802)  HBsAg: Negative (09/14 0000)  HIV: Non-reactive (09/14 0000)  GBS: Negative/-- (03/28 0000)  1 hr Glucola : 88 Genetic Screening: NIPS low risk, AFP-1 negative Ultrasound: normal anatomy, fundal/left lateral placenta, AFI 24.8cm  TDaP          UTD COVID-19 UTD    Maternal Diabetes: No Genetic Screening: Normal Maternal Ultrasounds/Referrals: Normal Fetal Ultrasounds or other Referrals:  Other: Polyhydramnios Maternal Substance Abuse:  No Significant Maternal Medications:  None Significant Maternal Lab Results:  Group B Strep negative Other Comments:  None  Medical / Surgical History : Past medical history:  Past Medical History:  Diagnosis Date  . Medical history non-contributory     Past surgical history:  Past Surgical History:  Procedure Laterality Date  . NO PAST SURGERIES      Family History:  Family History  Problem Relation Age of Onset  . Healthy Mother     Social History:  reports that she has never smoked. She has never used smokeless tobacco. She reports previous alcohol use. She reports that she does not use drugs.   Allergies: Patient has no known allergies.   Current Medications at  time of admission:  Medications Prior to Admission  Medication Sig Dispense Refill Last Dose  . doxylamine, Sleep, (UNISOM) 25 MG tablet Take 25 mg by mouth at bedtime as needed.   09/08/2020 at Unknown time  . Prenatal Vit-Fe Fumarate-FA (PRENATAL MULTIVITAMIN) TABS tablet Take 1 tablet by mouth at bedtime.   09/08/2020 at Unknown time    Review of Systems: Review of Systems  All other systems reviewed and are negative.  Physical Exam: Vital signs and nursing notes reviewed.  Patient Vitals for the past 24 hrs:  BP Temp Temp src Pulse Resp Height Weight  09/09/20 1610 138/79 -- -- (!) 108 18 -- --  09/09/20 1514 133/83 98.2 F (36.8 C) Oral (!) 103 18 -- --  09/09/20 1400 107/62 -- -- 97 18 -- --  09/09/20 1238 123/65 98.1 F (36.7 C) Oral (!) 105 18 -- --  09/09/20 1130 115/85 -- -- 83 16 -- --  09/09/20 0835 118/76 -- -- 97 16 -- --  09/09/20 0736 107/78 -- -- (!) 130 15 5\' 5"  (1.651 m) 79.5 kg    General: AAO x 3, NAD Heart: RRR Lungs:CTAB Abdomen: Gravid, NT Extremities: no edema Genitalia / VE: Dilation: 1 Effacement (%): 50 Station: ballotable Presentation: Vertex, confirmed by bedside U/S Exam by:: A Damond Borchers CNM   FHR: 135BPM, mod variability, + accels, no decels TOCO: Contractions  occasional  Labs:   Pending T&S, CBC, RPR  Recent Labs    09/09/20 0802  WBC 8.1  HGB 13.1  HCT 38.5  PLT 134*  Assessment:  35 y.o. G3P1001 at [redacted]w[redacted]d, polyhydramnios  1. IOL 2. FHR category 1 3. GBS negative 4. Desires hydrotherapy, unmedicated labor 5. Plans to breastfeed  Plan:  1. Admit to BS 2. Routine L&D orders 3. Analgesia/anesthesia PRN, waterbirth tub unavailable, may use shower for hydrotherapy 4. Buccal Cytotec q 4 hours, consider Foley balloon when able 5. Controlled AROM after adequate contraction pattern is established 6. Anticipate NSVB  Dr. Ernestina Penna notified of admission/plan of care.  June Leap CNM, MSN 09/09/2020, 4:49 PM

## 2020-09-09 NOTE — Progress Notes (Signed)
S: Breathing and moaning through contractions. Feeling occasional rectal pressure. Requests SVE.  O: Vitals:   09/09/20 1400 09/09/20 1514 09/09/20 1610 09/09/20 1851  BP: 107/62 133/83 138/79 94/66  Pulse: 97 (!) 103 (!) 108 (!) 104  Resp: 18 18 18 20   Temp:  98.2 F (36.8 C)  98.1 F (36.7 C)  TempSrc:  Oral  Oral  Weight:      Height:       FHT:  FHR: 145 bpm, variability: moderate,  accelerations:  Present,  decelerations:  Absent UC:   regular, every 2-3 minutes SVE:   Dilation: 3 Effacement (%): 70 Station: -2 Exam by:: A Jaecob Lowden CNM  A / P: Induction of labor due to polyhydramnios, s/p 2 doses of buccal Cytotec, AROM of clear fluid  Fetal Wellbeing:  Category I Pain Control:  Labor support without medications Anticipated MOD:  NSVD   Discussed the R/B/A of Pitocin for labor augmentation. Patient would like to proceed with Pitocin.  Start Pitocin 2x2 until active labor is achieved.   [phone], CNM, MSN 09/09/2020, 7:38 PM

## 2020-09-10 ENCOUNTER — Encounter (HOSPITAL_COMMUNITY): Payer: Self-pay | Admitting: Obstetrics

## 2020-09-10 LAB — CBC
HCT: 33.7 % — ABNORMAL LOW (ref 36.0–46.0)
Hemoglobin: 11.8 g/dL — ABNORMAL LOW (ref 12.0–15.0)
MCH: 32.9 pg (ref 26.0–34.0)
MCHC: 35 g/dL (ref 30.0–36.0)
MCV: 93.9 fL (ref 80.0–100.0)
Platelets: 166 10*3/uL (ref 150–400)
RBC: 3.59 MIL/uL — ABNORMAL LOW (ref 3.87–5.11)
RDW: 12.7 % (ref 11.5–15.5)
WBC: 20 10*3/uL — ABNORMAL HIGH (ref 4.0–10.5)
nRBC: 0 % (ref 0.0–0.2)

## 2020-09-10 MED ORDER — PRENATAL MULTIVITAMIN CH
1.0000 | ORAL_TABLET | Freq: Every day | ORAL | Status: DC
  Administered 2020-09-10: 1 via ORAL
  Filled 2020-09-10: qty 1

## 2020-09-10 MED ORDER — DIPHENHYDRAMINE HCL 25 MG PO CAPS: 25.0000 mg | ORAL_CAPSULE | Freq: Four times a day (QID) | ORAL | Status: DC | PRN

## 2020-09-10 MED ORDER — ONDANSETRON HCL 4 MG PO TABS: 4.0000 mg | ORAL_TABLET | ORAL | Status: DC | PRN

## 2020-09-10 MED ORDER — ACETAMINOPHEN 325 MG PO TABS
650.0000 mg | ORAL_TABLET | ORAL | Status: DC | PRN
  Administered 2020-09-10 – 2020-09-11 (×3): 650 mg via ORAL
  Filled 2020-09-10 (×3): qty 2

## 2020-09-10 MED ORDER — ZOLPIDEM TARTRATE 5 MG PO TABS: 5.0000 mg | ORAL_TABLET | Freq: Every evening | ORAL | Status: DC | PRN

## 2020-09-10 MED ORDER — SENNOSIDES-DOCUSATE SODIUM 8.6-50 MG PO TABS
2.0000 | ORAL_TABLET | Freq: Every day | ORAL | Status: DC
  Administered 2020-09-10: 2 via ORAL
  Filled 2020-09-10: qty 2

## 2020-09-10 MED ORDER — SIMETHICONE 80 MG PO CHEW: 80.0000 mg | CHEWABLE_TABLET | ORAL | Status: DC | PRN

## 2020-09-10 MED ORDER — IBUPROFEN 600 MG PO TABS
600.0000 mg | ORAL_TABLET | Freq: Four times a day (QID) | ORAL | Status: DC
  Administered 2020-09-10 – 2020-09-11 (×6): 600 mg via ORAL
  Filled 2020-09-10 (×5): qty 1

## 2020-09-10 MED ORDER — CALCIUM CARBONATE ANTACID 500 MG PO CHEW
1.0000 | CHEWABLE_TABLET | ORAL | Status: DC | PRN
  Administered 2020-09-10 (×3): 200 mg via ORAL
  Filled 2020-09-10 (×3): qty 1

## 2020-09-10 MED ORDER — COCONUT OIL OIL: 1.0000 "application " | TOPICAL_OIL | Status: DC | PRN

## 2020-09-10 MED ORDER — BENZOCAINE-MENTHOL 20-0.5 % EX AERO: 1.0000 "application " | INHALATION_SPRAY | CUTANEOUS | Status: DC | PRN

## 2020-09-10 MED ORDER — ONDANSETRON HCL 4 MG/2ML IJ SOLN: 4.0000 mg | INTRAMUSCULAR | Status: DC | PRN

## 2020-09-10 MED ORDER — WITCH HAZEL-GLYCERIN EX PADS: 1.0000 "application " | MEDICATED_PAD | CUTANEOUS | Status: DC | PRN

## 2020-09-10 MED ORDER — TETANUS-DIPHTH-ACELL PERTUSSIS 5-2.5-18.5 LF-MCG/0.5 IM SUSY: 0.5000 mL | PREFILLED_SYRINGE | Freq: Once | INTRAMUSCULAR | Status: DC

## 2020-09-10 MED ORDER — DIBUCAINE (PERIANAL) 1 % EX OINT: 1.0000 "application " | TOPICAL_OINTMENT | CUTANEOUS | Status: DC | PRN

## 2020-09-10 NOTE — Progress Notes (Signed)
PPD # 1 S/P NSVD  Live born female  Birth Weight: 7 lb 14.3 oz (3581 g) APGAR: 9, 9  Newborn Delivery   Birth date/time: 09/09/2020 22:45:00 Delivery type: Vaginal, Spontaneous     Baby name: Mary Baldwin Delivering provider: Dorisann Frames K   Episiotomy:None   Lacerations:None   Circumcision Yes, planning outpatient  Feeding: breast  Pain control at delivery: None   Subjective   Reports feeling well with no complaints. Bleeding is stable. Husband at the bedside.              Tolerating po/ No nausea or vomiting             Bleeding is light             Pain controlled with acetaminophen and ibuprofen (OTC)             Up ad lib / ambulatory / voiding without difficulties   Objective   A & O x 3, in no apparent distress               Vitals:   09/10/20 0100 09/10/20 0115 09/10/20 0215 09/10/20 0620  BP: 129/85 (!) 145/78 124/82 107/66  Pulse: 97 (!) 111 (!) 103 100  Resp:  18 18 18   Temp:  98.6 F (37 C) 98.1 F (36.7 C) 98.6 F (37 C)  TempSrc:  Oral Oral Oral  SpO2:  99% 99% 99%  Weight:      Height:         Recent Labs    09/09/20 0802 09/10/20 0455  WBC 8.1 20.0*  HGB 13.1 11.8*  HCT 38.5 33.7*  PLT 134* 166    Blood type: --/--/A POS (04/08 0802)  Rubella: Immune (09/14 0000)   Vaccines:   TDaP   UTD                             COVID-19 UTD   Gen: AAO x 3, NAD  Abdomen: soft, non-tender, non-distended             Fundus: firm, non-tender, U-2  Perineum: intact  Lochia: small  Extremities: no edema, no calf pain or tenderness   Assessment/Plan PPD # 1 34 y.o., 10-27-2004  Principal Problem:   Postpartum care following vaginal delivery 4/8 Active Problems:   SVD 4/8   Polyhydramnios   Diastasis recti   Doing well - stable status  Routine post partum orders  Anticipate discharge tomorrow.   6/8, MSN, CNM 09/10/2020, 7:24 AM

## 2020-09-10 NOTE — Progress Notes (Signed)
Called Dorisann Frames per patient request for tums. Order was giving .

## 2020-09-10 NOTE — Lactation Note (Signed)
This note was copied from a baby's chart. Lactation Consultation Note  Patient Name: Boy Kortlynn Poust Today's Date: 09/10/2020 Reason for consult: Initial assessment Age:34 hours Mother reports that she breastfed her other child for 18 months and that child had a tongue tie that was clipped. Mother would like this child to have tongue clipped while in the hospital if possible. Informed mother that only a few Peds MD are able to do procedure.  I will let nursery staff know. Also gave mother a handout with Specialist names.   Observed a feeding. Infants lips very pursed. Infant top lip rolls upward but difficult to do chin tug. Observed pinch along side of mothers nipple. Mother denies having any pain with latch. Only observed a few swallows.   Mother to continue to cue base feed infant and feed at least 8-12 times or more in 24 hours and advised to allow for cluster feeding infant as needed.  Mother to continue to due STS. Mother is aware of available LC services at [facility], BFSG'S, OP Dept, and phone # for questions or concerns about breastfeeding.  Mother receptive to all teaching and plan of care.   Maternal Data    Feeding Mother's Current Feeding Choice: Breast Milk  LATCH Score Latch: Grasps breast easily, tongue down, lips flanged, rhythmical sucking.  Audible Swallowing: Spontaneous and intermittent  Type of Nipple: Everted at rest and after stimulation  Comfort (Breast/Nipple): Soft / non-tender  Hold (Positioning): Assistance needed to correctly position infant at breast and maintain latch.  LATCH Score: 9   Lactation Tools Discussed/Used Tools: Pump  Interventions Interventions: Hand express;Breast compression;Adjust position;Support pillows;Position options  Discharge Pump: Personal (Spectra)  Consult Status Consult Status: Follow-up Date: 09/11/20 Follow-up type: In-patient    Stevan Born [facility] 09/10/2020, 12:28 PM

## 2020-09-11 MED ORDER — IBUPROFEN 600 MG PO TABS: 600.0000 mg | ORAL_TABLET | Freq: Four times a day (QID) | ORAL | 0 refills | Status: DC

## 2020-09-11 MED ORDER — ACETAMINOPHEN 325 MG PO TABS: 650.0000 mg | ORAL_TABLET | ORAL | 1 refills | Status: DC | PRN

## 2020-09-11 NOTE — Discharge Summary (Signed)
OB Discharge Summary  Patient Name: Mary Baldwin DOB: March 15, 1986 MRN: [id number]  Date of admission: 09/09/2020 Delivering provider: Dorisann Frames K   Admitting diagnosis: Pregnancy [Z34.90] Intrauterine pregnancy: [redacted]w[redacted]d     Secondary diagnosis: Patient Active Problem List   Diagnosis Date Noted  . SVD 4/8 09/09/2020  . Postpartum care following vaginal delivery 4/8 09/09/2020  . Polyhydramnios 09/09/2020  . Diastasis recti 09/09/2020    Date of discharge: 09/11/2020   Discharge diagnosis: Principal Problem:   Postpartum care following vaginal delivery 4/8 Active Problems:   SVD 4/8   Polyhydramnios   Diastasis recti                                                             Post partum procedures:None  Augmentation: AROM, Pitocin and Cytotec Pain control: None  Laceration:None  Episiotomy:None  Complications: None  Hospital course:  Induction of Labor With Vaginal Delivery   35 y.o. yo G3P2002 at [redacted]w[redacted]d was admitted to the hospital 09/09/2020 for induction of labor.  Indication for induction: polyhydramnios.  Patient had an uncomplicated labor course as follows: Membrane Rupture Time/Date: 4:07 PM ,09/09/2020   Delivery Method:Vaginal, Spontaneous  Episiotomy: None  Lacerations:  None  Details of delivery can be found in separate delivery note.  Patient had a routine postpartum course. Patient is discharged home 09/11/20.  Newborn Data: Birth date:09/09/2020  Birth time:10:45 PM  Gender:Female  Living status:Living  Apgars:9 ,9  Weight:3581 g   Physical exam  Vitals:   09/10/20 1300 09/10/20 1817 09/10/20 2156 09/11/20 0529  BP: 121/71 108/78 118/72 111/74  Pulse: 87 (!) 107 95 90  Resp: 18 18 18 18   Temp: 98.1 F (36.7 C) 98 F (36.7 C) 98.2 F (36.8 C)   TempSrc: Oral Oral Oral   SpO2:  100%  100%  Weight:      Height:       General: alert, cooperative and no distress Lochia: appropriate Uterine Fundus: firm Perineum: intact DVT Evaluation: No evidence  of DVT seen on physical exam. No significant calf/ankle edema. Labs: Lab Results  Component Value Date   WBC 20.0 (H) 09/10/2020   HGB 11.8 (L) 09/10/2020   HCT 33.7 (L) 09/10/2020   MCV 93.9 09/10/2020   PLT 166 09/10/2020   No flowsheet data found. Edinburgh Postnatal Depression Scale Screening Tool 09/10/2020  I have been able to laugh and see the funny side of things. (No Data)   Vaccines: TDaP UTD         COVID-19   UTD  Discharge instructions:  per After Visit Summary  After Visit Meds:  Allergies as of 09/11/2020   No Known Allergies     Medication List    STOP taking these medications   doxylamine (Sleep) 25 MG tablet Commonly known as: UNISOM     TAKE these medications   acetaminophen 325 MG tablet Commonly known as: Tylenol Take 2 tablets (650 mg total) by mouth every 4 (four) hours as needed (for pain scale < 4).   ibuprofen 600 MG tablet Commonly known as: ADVIL Take 1 tablet (600 mg total) by mouth every 6 (six) hours.   prenatal multivitamin Tabs tablet Take 1 tablet by mouth at bedtime.            Discharge  Care Instructions  (From admission, onward)         Start     Ordered   09/11/20 0000  Discharge wound care:       Comments: Sitz baths 2 times /day with warm water x 1 week. May add herbals: 1 ounce dried comfrey leaf* 1 ounce calendula flowers 1 ounce lavender flowers  Supplies can be found online at [employer] sources at [employer], [employer]  1/2 ounce dried uva ursi leaves 1/2 ounce witch hazel blossoms (if you can find them) 1/2 ounce dried sage leaf 1/2 cup sea salt Directions: Bring 2 quarts of water to a boil. Turn off heat, and place 1 ounce (approximately 1 large handful) of the above mixed herbs (not the salt) into the pot. Steep, covered, for 30 minutes.  Strain the liquid well with a fine mesh strainer, and discard the herb material. Add 2 quarts of liquid to the tub, along with the 1/2 cup of  salt. This medicinal liquid can also be made into compresses and peri-rinses.   09/11/20 1057         Diet: routine diet  Activity: Advance as tolerated. Pelvic rest for 6 weeks.   Newborn Data: Live born female  Birth Weight: 7 lb 14.3 oz (3581 g) APGAR: 9, 9  Newborn Delivery   Birth date/time: 09/09/2020 22:45:00 Delivery type: Vaginal, Spontaneous     Named Cornelius Moras Baby Feeding: Breast Disposition:home with mother  Delivery Report:   Review the Delivery Report for details.    Follow up:  Follow-up Information    June Leap, CNM. Schedule an appointment as soon as possible for a visit in 6 week(s).   Specialty: Certified Nurse Midwife Contact information: [address] [place] [address] [phone]              June Leap, CNM, MSN 09/11/2020, 10:58 AM

## 2020-09-11 NOTE — Lactation Note (Signed)
This note was copied from a baby's chart. Lactation Consultation Note  Patient Name: Mary Baldwin THYHO'O Date: 09/11/2020 Reason for consult: Follow-up assessment Age:35 hours   P2 mother whose infant is now 9 hours old.  This is a term baby at 39+0 weeks.  Mother breast fed her first child for 18 months.  Mother stated that this baby has a lip tie and that the pediatrician will be making a referral to a specialist.  Her first child had a tongue tie with revision so parents are familiar with this diagnosis.    Mother had no questions/concerns related to breast feeding.  Provided coconut oil with instructions for use for nipple comfort.  Mother very appreciative.  She was finishing a breast feeding session when I arrived and baby was calm and content during the feeding.  Discussed how to make an OP LC visit if desired.  Mother also familiar with this since she did have OP follow up visits with her first child.  Family has been discharged.     Maternal Data    Feeding    LATCH Score                    Lactation Tools Discussed/Used    Interventions    Discharge    Consult Status Consult Status: Complete Date: 09/11/20 Follow-up type: Call as needed    Antwoin Lackey R Feiga Nadel 09/11/2020, 11:32 AM

## 2020-11-03 ENCOUNTER — Ambulatory Visit (INDEPENDENT_AMBULATORY_CARE_PROVIDER_SITE_OTHER): Payer: BLUE CROSS/BLUE SHIELD

## 2020-11-03 ENCOUNTER — Other Ambulatory Visit: Payer: Self-pay

## 2020-11-03 ENCOUNTER — Ambulatory Visit (INDEPENDENT_AMBULATORY_CARE_PROVIDER_SITE_OTHER): Payer: BLUE CROSS/BLUE SHIELD | Admitting: Podiatry

## 2020-11-03 DIAGNOSIS — S90121A Contusion of right lesser toe(s) without damage to nail, initial encounter: Secondary | ICD-10-CM

## 2020-11-03 DIAGNOSIS — S92501A Displaced unspecified fracture of right lesser toe(s), initial encounter for closed fracture: Secondary | ICD-10-CM

## 2020-11-07 ENCOUNTER — Encounter: Payer: Self-pay | Admitting: Podiatry

## 2020-11-07 NOTE — Progress Notes (Signed)
  Subjective:  Patient ID: Mary Baldwin, female    DOB: [birth year],  MRN: [id number]  Chief Complaint  Patient presents with  . Toe Injury    (np) stubbed right pinky toe 3 weeks ago still having a lot of pain when walking    35 y.o. female presents with the above complaint. History confirmed with patient.   Objective:  Physical Exam: warm, good capillary refill, no trophic changes or ulcerative lesions, normal DP and PT pulses and normal sensory exam.   Right Foot: Right fifth PIP J painful with attempted range of motion  No images are attached to the encounter.  Radiographs: X-ray of the right foot:  Fracture of previous biphalangeal fifth toe Assessment:   1. Closed fracture of phalanx of right fifth toe, initial encounter      Plan:  Patient was evaluated and treated and all questions answered.  Discussed treatment options of toe fractures in detail with the patient.  Recommended buddy splinting.  RICE protocol reviewed for rest and ice.  OTC NSAIDs as needed  Return if symptoms worsen or fail to improve.

## 2021-01-13 ENCOUNTER — Institutional Professional Consult (permissible substitution): Payer: BLUE CROSS/BLUE SHIELD | Admitting: Plastic Surgery

## 2021-08-28 ENCOUNTER — Ambulatory Visit (INDEPENDENT_AMBULATORY_CARE_PROVIDER_SITE_OTHER): Payer: BC Managed Care – PPO | Admitting: Podiatry

## 2021-08-28 ENCOUNTER — Other Ambulatory Visit: Payer: Self-pay

## 2021-08-28 DIAGNOSIS — L84 Corns and callosities: Secondary | ICD-10-CM | POA: Diagnosis not present

## 2021-08-28 DIAGNOSIS — M205X9 Other deformities of toe(s) (acquired), unspecified foot: Secondary | ICD-10-CM

## 2021-08-30 NOTE — Progress Notes (Signed)
Subjective: ?36 year old female presents the office today for concerns of corns on both of her fifth toes.  She states that she used to think this was extra toenail however she did see podiatrist and she was told that she had a listers corn.  Areas are tender with pressure and with shoes.  No swelling redness or drainage.  No other concerns. ? ?Objective: ?AAO x3, NAD ?DP/PT pulses palpable bilaterally, CRT less than 3 seconds ?Adductovarus present fifth toes.  Lateral to the fifth digit toenail is area of hyperkeratotic tissue consistent with a listers corn.  There is no underlying ulceration drainage or any signs of infection.  No pain with calf compression, swelling, warmth, erythema ? 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 ?Plan: ?-All treatment options discussed with the patient including all alternatives, risks, complications.  ?-Sharp to be the skin lesions today.  Very minimal pinpoint bleeding occurred on the right side.  Skin with alcohol and antibiotic ointment and bandage was applied.  Monitoring signs or symptoms of infection.  Discussed offloading and she modifications to avoid pressure.  Ultimately consider surgical intervention in the future if needed. ?-Patient encouraged to call the office with any questions, concerns, change in symptoms.  ? ?Trula Slade DPM ? ?

## 2021-10-19 ENCOUNTER — Telehealth: Payer: BC Managed Care – PPO | Admitting: Physician Assistant

## 2021-10-19 DIAGNOSIS — J020 Streptococcal pharyngitis: Secondary | ICD-10-CM

## 2021-10-19 MED ORDER — AMOXICILLIN 500 MG PO CAPS: 500.0000 mg | ORAL_CAPSULE | Freq: Two times a day (BID) | ORAL | 0 refills | Status: AC

## 2021-10-19 NOTE — Progress Notes (Signed)
Virtual Visit Consent   Infantof Mary Baldwin, you are scheduled for a virtual visit with a [facility] provider today. Just as with appointments in the office, your consent must be obtained to participate. Your consent will be active for this visit and any virtual visit you may have with one of our providers in the next 365 days. If you have a MyChart account, a copy of this consent can be sent to you electronically.  As this is a virtual visit, video technology does not allow for your provider to perform a traditional examination. This may limit your provider's ability to fully assess your condition. If your provider identifies any concerns that need to be evaluated in person or the need to arrange testing (such as labs, EKG, etc.), we will make arrangements to do so. Although advances in technology are sophisticated, we cannot ensure that it will always work on either your end or our end. If the connection with a video visit is poor, the visit may have to be switched to a telephone visit. With either a video or telephone visit, we are not always able to ensure that we have a secure connection.  By engaging in this virtual visit, you consent to the provision of healthcare and authorize for your insurance to be billed (if applicable) for the services provided during this visit. Depending on your insurance coverage, you may receive a charge related to this service.  I need to obtain your verbal consent now. Are you willing to proceed with your visit today? Bre Carissa Musick has provided verbal consent on 10/19/2021 for a virtual visit (video or telephone). Margaretann Loveless, PA-C  Date: 10/19/2021 12:29 PM  Virtual Visit via Video Note   I, Margaretann Loveless, connected with  Mary Baldwin  ([id number], [birth year]) on 10/19/21 at 12:30 PM EDT by a video-enabled telemedicine application and verified that I am speaking with the correct person using two identifiers.  Location: Patient: Virtual Visit  Location Patient: Home Provider: Virtual Visit Location Provider: Home Office   I discussed the limitations of evaluation and management by telemedicine and the availability of in person appointments. The patient expressed understanding and agreed to proceed.    History of Present Illness: Mary Baldwin is a 36 y.o. who identifies as a female who was assigned female at birth, and is being seen today for sore throat.  HPI: Sore Throat  This is a new problem. The problem has been gradually worsening. There has been no fever. Associated symptoms include congestion, coughing, headaches, swollen glands and trouble swallowing. Pertinent negatives include no ear discharge, ear pain, hoarse voice or plugged ear sensation. Associated symptoms comments: Post nasal drainage, fatigue. She has had exposure to strep. She has had no exposure to mono. Exposure to: 22 yr old daughter tested positive on Tuesday. She has tried nothing for the symptoms. The treatment provided no relief.     Problems:  Patient Active Problem List   Diagnosis Date Noted   SVD 4/8 09/09/2020   Postpartum care following vaginal delivery 4/8 09/09/2020   Polyhydramnios 09/09/2020   Diastasis recti 09/09/2020    Allergies: No Known Allergies Medications:  Current Outpatient Medications:    amoxicillin (AMOXIL) 500 MG capsule, Take 1 capsule (500 mg total) by mouth 2 (two) times daily for 10 days., Disp: 20 capsule, Rfl: 0   acetaminophen (TYLENOL) 325 MG tablet, Take 2 tablets (650 mg total) by mouth every 4 (four) hours as needed (for pain scale <  4)., Disp: 30 tablet, Rfl: 1   ibuprofen (ADVIL) 600 MG tablet, Take 1 tablet (600 mg total) by mouth every 6 (six) hours., Disp: 30 tablet, Rfl: 0   Prenatal Vit-Fe Fumarate-FA (PRENATAL MULTIVITAMIN) TABS tablet, Take 1 tablet by mouth at bedtime., Disp: , Rfl:   Observations/Objective: Patient is well-developed, well-nourished in no acute distress.  Resting comfortably at home.   Head is normocephalic, atraumatic.  No labored breathing.  Speech is clear and coherent with logical content.  Patient is alert and oriented at baseline.    Assessment and Plan: 1. Strep throat - amoxicillin (AMOXIL) 500 MG capsule; Take 1 capsule (500 mg total) by mouth 2 (two) times daily for 10 days.  Dispense: 20 capsule; Refill: 0  - Suspect strep throat - Amoxicillin prescribed - Tylenol and Ibuprofen alternating every 4 hours - Salt water gargles - Chloraseptic spray - Liquid and soft food diet - Push fluids - Seek in person evaluation if not improving or if symptoms worsen   Follow Up Instructions: I discussed the assessment and treatment plan with the patient. The patient was provided an opportunity to ask questions and all were answered. The patient agreed with the plan and demonstrated an understanding of the instructions.  A copy of instructions were sent to the patient via MyChart unless otherwise noted below.    The patient was advised to call back or seek an in-person evaluation if the symptoms worsen or if the condition fails to improve as anticipated.  Time:  I spent 10 minutes with the patient via telehealth technology discussing the above problems/concerns.    Margaretann Loveless, PA-C

## 2021-10-19 NOTE — Patient Instructions (Signed)
Mary Baldwin, thank you for joining Mar Daring, PA-C for today's virtual visit.  While this provider is not your primary care provider (PCP), if your PCP is located in our provider database this encounter information will be shared with them immediately following your visit.  Consent: (Patient) Mary Baldwin provided verbal consent for this virtual visit at the beginning of the encounter.  Current Medications:  Current Outpatient Medications:    amoxicillin (AMOXIL) 500 MG capsule, Take 1 capsule (500 mg total) by mouth 2 (two) times daily for 10 days., Disp: 20 capsule, Rfl: 0   acetaminophen (TYLENOL) 325 MG tablet, Take 2 tablets (650 mg total) by mouth every 4 (four) hours as needed (for pain scale < 4)., Disp: 30 tablet, Rfl: 1   ibuprofen (ADVIL) 600 MG tablet, Take 1 tablet (600 mg total) by mouth every 6 (six) hours., Disp: 30 tablet, Rfl: 0   Prenatal Vit-Fe Fumarate-FA (PRENATAL MULTIVITAMIN) TABS tablet, Take 1 tablet by mouth at bedtime., Disp: , Rfl:    Medications ordered in this encounter:  Meds ordered this encounter  Medications   amoxicillin (AMOXIL) 500 MG capsule    Sig: Take 1 capsule (500 mg total) by mouth 2 (two) times daily for 10 days.    Dispense:  20 capsule    Refill:  0    Order Specific Question:   Supervising Provider    Answer:   Sabra Heck, [place]     *If you need refills on other medications prior to your next appointment, please contact your pharmacy*  Follow-Up: Call back or seek an in-person evaluation if the symptoms worsen or if the condition fails to improve as anticipated.  Other Instructions Strep Throat, Adult Strep throat is an infection in the throat that is caused by bacteria. It is common during the cold months of the year. It mostly affects children who are 88-30 years old. However, people of all ages can get it at any time of the year. This infection spreads from person to person (is contagious) through coughing,  sneezing, or having close contact. Your health care provider may use other names to describe the infection. When strep throat affects the tonsils, it is called tonsillitis. When it affects the back of the throat, it is called pharyngitis. What are the causes? This condition is caused by the Streptococcus pyogenes bacteria. What increases the risk? You are more likely to develop this condition if: You care for school-age children, or are around school-age children. Children are more likely to get strep throat and may spread it to others. You spend time in crowded places where the infection can spread easily. You have close contact with someone who has strep throat. What are the signs or symptoms? Symptoms of this condition include: Fever or chills. Redness, swelling, or pain in the tonsils or throat. Pain or difficulty when swallowing. White or yellow spots on the tonsils or throat. Tender glands in the neck and under the jaw. Bad smelling breath. Red rash all over the body. This is rare. How is this diagnosed? This condition is diagnosed by tests that check for the presence and the amount of bacteria that cause strep throat. They are: Rapid strep test. Your throat is swabbed and checked for the presence of bacteria. Results are usually ready in minutes. Throat culture test. Your throat is swabbed. The sample is placed in a cup that allows infections to grow. Results are usually ready in 1 or 2 days. How is this  treated? This condition may be treated with: Medicines that kill germs (antibiotics). Medicines that relieve pain or fever. These include: Ibuprofen or acetaminophen. Aspirin, only for people who are over the age of 53. Throat lozenges. Throat sprays. Follow these instructions at home: Medicines  Take over-the-counter and prescription medicines only as told by your health care provider. Take your antibiotic medicine as told by your health care provider. Do not stop taking the  antibiotic even if you start to feel better. Eating and drinking  If you have trouble swallowing, try eating soft foods until your sore throat feels better. Drink enough fluid to keep your urine pale yellow. To help relieve pain, you may have: Warm fluids, such as soup and tea. Cold fluids, such as frozen desserts or popsicles. General instructions Gargle with a salt-water mixture 3-4 times a day or as needed. To make a salt-water mixture, completely dissolve -1 tsp (3-6 g) of salt in 1 cup (237 mL) of warm water. Get plenty of rest. Stay home from work or school until you have been taking antibiotics for 24 hours. Do not use any products that contain nicotine or tobacco. These products include cigarettes, chewing tobacco, and vaping devices, such as e-cigarettes. If you need help quitting, ask your health care provider. It is up to you to get your test results. Ask your health care provider, or the department that is doing the test, when your results will be ready. Keep all follow-up visits. This is important. How is this prevented?  Do not share food, drinking cups, or personal items that could cause the infection to spread to other people. Wash your hands often with soap and water for at least 20 seconds. If soap and water are not available, use hand sanitizer. Make sure that all people in your house wash their hands well. Have family members tested if they have a sore throat or fever. They may need an antibiotic if they have strep throat. Contact a health care provider if: You have swelling in your neck that keeps getting bigger. You develop a rash, cough, or earache. You cough up a thick mucus that is green, yellow-brown, or bloody. You have pain or discomfort that does not get better with medicine. Your symptoms seem to be getting worse. You have a fever. Get help right away if: You have new symptoms, such as vomiting, severe headache, stiff or painful neck, chest pain, or  shortness of breath. You have severe throat pain, drooling, or changes in your voice. You have swelling of the neck, or the skin on the neck becomes red and tender. You have signs of dehydration, such as tiredness (fatigue), dry mouth, and decreased urination. You become increasingly sleepy, or you cannot wake up completely. Your joints become red or painful. These symptoms may represent a serious problem that is an emergency. Do not wait to see if the symptoms will go away. Get medical help right away. Call your local emergency services (911 in the [place] Do not drive yourself to the hospital. Summary Strep throat is an infection in the throat that is caused by the Streptococcus pyogenes bacteria. This infection is spread from person to person (is contagious) through coughing, sneezing, or having close contact. Take your medicines, including antibiotics, as told by your health care provider. Do not stop taking the antibiotic even if you start to feel better. To prevent the spread of germs, wash your hands well with soap and water. Have others do the same. Do not share  food, drinking cups, or personal items. Get help right away if you have new symptoms, such as vomiting, severe headache, stiff or painful neck, chest pain, or shortness of breath. This information is not intended to replace advice given to you by your health care provider. Make sure you discuss any questions you have with your health care provider. Document Revised: 09/13/2020 Document Reviewed: 09/13/2020 Elsevier Patient Education  [place].    If you have been instructed to have an in-person evaluation today at a local Urgent Care facility, please use the link below. It will take you to a list of all of our available [facility], including address, phone number and hours of operation. Please do not delay care.  [facility]  If you or a family member do not have a primary care provider, use  the link below to schedule a visit and establish care. When you choose a [facility] primary care physician or advanced practice provider, you gain a long-term partner in health. Find a Primary Care Provider  Learn more about [facility]'s in-office and virtual care options: [facility]

## 2021-12-08 DIAGNOSIS — K429 Umbilical hernia without obstruction or gangrene: Secondary | ICD-10-CM | POA: Insufficient documentation

## 2022-03-22 ENCOUNTER — Ambulatory Visit: Payer: BC Managed Care – PPO | Admitting: Podiatry

## 2022-05-04 ENCOUNTER — Encounter: Payer: Self-pay | Admitting: Podiatry

## 2022-05-04 ENCOUNTER — Ambulatory Visit (INDEPENDENT_AMBULATORY_CARE_PROVIDER_SITE_OTHER): Payer: BC Managed Care – PPO | Admitting: Podiatry

## 2022-05-04 DIAGNOSIS — M205X9 Other deformities of toe(s) (acquired), unspecified foot: Secondary | ICD-10-CM

## 2022-05-04 HISTORY — PX: HERNIA REPAIR: SHX51

## 2022-05-04 NOTE — Progress Notes (Signed)
Subjective: Patient presents with to the office bilateral fifth digit Lister corn.  She states that debridement does help.  She is made some shoe gear modification which has not helped much.  But she wanted to get it evaluated.  Denies any other acute complaints  Objective: AAO x3, NAD DP/PT pulses palpable bilaterally, CRT less than 3 seconds Adductovarus present fifth toes.  Lateral to the fifth digit toenail is area of hyperkeratotic tissue consistent with a listers corn.  There is no underlying ulceration drainage or any signs of infection.  No pain with calf compression, swelling, warmth, erythema  Assessment: Listers corn bilateral fifth  Plan: -All treatment options discussed with the patient including all alternatives, risks, complications.  -I discussed shoe gear modification extensive detail.  As part of a courtesy using chisel blade handle the blisters were debrided down to healthy striated tissue.  Immediate relief noted. -I discussed surgical options as well.  She will think about the surgical options. -Patient encouraged to call the office with any questions, concerns, change in symptoms.   Candelaria Stagers DPM

## 2022-05-21 DIAGNOSIS — Z419 Encounter for procedure for purposes other than remedying health state, unspecified: Secondary | ICD-10-CM | POA: Insufficient documentation

## 2022-08-06 ENCOUNTER — Ambulatory Visit (INDEPENDENT_AMBULATORY_CARE_PROVIDER_SITE_OTHER): Payer: BC Managed Care – PPO | Admitting: Podiatry

## 2022-08-06 VITALS — BP 107/54 | HR 53

## 2022-08-06 DIAGNOSIS — L989 Disorder of the skin and subcutaneous tissue, unspecified: Secondary | ICD-10-CM | POA: Diagnosis not present

## 2022-08-06 NOTE — Progress Notes (Signed)
   Chief Complaint  Patient presents with   Callouses    Listers corn bilateral fifth toes,        Subjective: 37 y.o. female presenting to the office today for routine debridement of Lister Corns to the bilateral fifth toes.  Patient states that she comes in routinely for debridement and she has about 3 months of relief.  She is an avid runner.  She is not interested in any aggressive or surgical treatment options for the moment   Past Medical History:  Diagnosis Date   Medical history non-contributory     Past Surgical History:  Procedure Laterality Date   NO PAST SURGERIES      No Known Allergies   Objective:  Physical Exam General: Alert and oriented x3 in no acute distress  Dermatology: Hyperkeratotic lesion(s) present on the lateral border of the fifth digits adjacent to the lateral portion of the nail plate. Pain on palpation with a central nucleated core noted. Skin is warm, dry and supple bilateral lower extremities. Negative for open lesions or macerations.  Vascular: Palpable pedal pulses bilaterally. No edema or erythema noted. Capillary refill within normal limits.  Neurological: Epicritic and protective threshold grossly intact bilaterally.   Musculoskeletal Exam: Pain on palpation at the keratotic lesion(s) noted. Range of motion within normal limits bilateral. Muscle strength 5/5 in all groups bilateral.  Assessment: 1.  Symptomatic callus; benign skin lesion lateral border of the nail plate fifth digits bilateral   Plan of Care:  1. Patient evaluated 2. Excisional debridement of keratoic lesion(s) using a chisel blade was performed without incident.  3.  Resume full activity no restrictions.  Patient did feel some relief 4.  Return to clinic as needed   Edrick Kins, DPM [facility]  Dr. Edrick Kins, DPM    [address], [place] [address]                Office [phone]  Fax  [phone]

## 2022-11-07 DIAGNOSIS — F419 Anxiety disorder, unspecified: Secondary | ICD-10-CM | POA: Insufficient documentation

## 2022-11-07 DIAGNOSIS — F1290 Cannabis use, unspecified, uncomplicated: Secondary | ICD-10-CM | POA: Insufficient documentation

## 2022-11-07 DIAGNOSIS — F902 Attention-deficit hyperactivity disorder, combined type: Secondary | ICD-10-CM | POA: Insufficient documentation

## 2023-07-18 ENCOUNTER — Ambulatory Visit (INDEPENDENT_AMBULATORY_CARE_PROVIDER_SITE_OTHER): Payer: BC Managed Care – PPO | Admitting: Podiatry

## 2023-07-18 ENCOUNTER — Encounter: Payer: Self-pay | Admitting: Podiatry

## 2023-07-18 VITALS — Ht 65.0 in | Wt 175.2 lb

## 2023-07-18 DIAGNOSIS — M79671 Pain in right foot: Secondary | ICD-10-CM | POA: Diagnosis not present

## 2023-07-18 DIAGNOSIS — M79672 Pain in left foot: Secondary | ICD-10-CM | POA: Diagnosis not present

## 2023-07-18 DIAGNOSIS — L84 Corns and callosities: Secondary | ICD-10-CM | POA: Diagnosis not present

## 2023-07-18 NOTE — Progress Notes (Signed)
  Subjective:  Patient ID: Mary Baldwin, female    DOB: [birth year],  MRN: [id number]  Chief Complaint  Patient presents with   Callouses    Pt is here due to bilateral lifters corn on both pinky toes, states they are painful and wants them removed.    38 y.o. female presents with the above complaint. History confirmed with patient.  Regular intermittent treatment has been helpful in reducing pain she is staying active and running.  She would like to continue this treatment.  Objective:  Physical Exam: warm, good capillary refill, no trophic changes or ulcerative lesions, normal DP and PT pulses, normal sensory exam, and listers corn bilateral fifth toe with adductovarus contracture of toe.  Assessment:   1. Callus of foot   2. Pain in both feet      Plan:  Patient was evaluated and treated and all questions answered.  All symptomatic hyperkeratoses were safely debrided with a sterile #15 blade to patient's level of comfort without incident. We discussed preventative and palliative care of these lesions including supportive and accommodative shoegear, padding, prefabricated and custom molded accommodative orthoses, use of a pumice stone and lotions/creams daily.  Silicone toe caps and digital corn pads dispensed.  Recommended urea 40% nail gel or cream   Return if symptoms worsen or fail to improve.

## 2023-07-18 NOTE — Patient Instructions (Addendum)
Look for urea 40% cream or ointment (there is also a nail gel that may work well for this) and apply to the thickened dry skin / calluses. This can be bought over the counter, at a pharmacy or online such as [employer].   More silicone pads can be purchased from:  [email or web address]

## 2024-01-27 ENCOUNTER — Encounter: Payer: Self-pay | Admitting: Emergency Medicine

## 2024-01-27 ENCOUNTER — Ambulatory Visit
Admission: EM | Admit: 2024-01-27 | Discharge: 2024-01-27 | Disposition: A | Discharge status: Home / Self Care | Attending: Internal Medicine | Admitting: Internal Medicine

## 2024-01-27 ENCOUNTER — Ambulatory Visit

## 2024-01-27 DIAGNOSIS — T3695XA Adverse effect of unspecified systemic antibiotic, initial encounter: Secondary | ICD-10-CM | POA: Diagnosis not present

## 2024-01-27 DIAGNOSIS — B379 Candidiasis, unspecified: Secondary | ICD-10-CM

## 2024-01-27 DIAGNOSIS — J029 Acute pharyngitis, unspecified: Secondary | ICD-10-CM | POA: Diagnosis not present

## 2024-01-27 DIAGNOSIS — J020 Streptococcal pharyngitis: Secondary | ICD-10-CM | POA: Diagnosis not present

## 2024-01-27 LAB — POCT RAPID STREP A (OFFICE): Rapid Strep A Screen: POSITIVE — AB

## 2024-01-27 MED ORDER — AMOXICILLIN 500 MG PO CAPS: 500.0000 mg | ORAL_CAPSULE | Freq: Two times a day (BID) | ORAL | 0 refills | Status: AC

## 2024-01-27 MED ORDER — FLUCONAZOLE 150 MG PO TABS: 150.0000 mg | ORAL_TABLET | ORAL | 0 refills | Status: DC

## 2024-01-27 NOTE — Discharge Instructions (Signed)
 You have strep throat.  - Take the prescribed antibiotic as directed for the next 10 days. - Take ibuprofen 600 mg every 6 hours as needed for throat pain and fever.  - Change your toothbrush after 2 to 3 days of antibiotic use to prevent reinfection. - You may also gargle with salt water and drink warm tea with honey to soothe your throat.  If you develop any new or worsening symptoms or if your symptoms do not start to improve, please return here or follow-up with your primary care provider. If your symptoms are severe, please go to the emergency room.

## 2024-01-27 NOTE — ED Triage Notes (Signed)
 Pt c/o sore throat and fatigue for 2 days.

## 2024-01-27 NOTE — ED Provider Notes (Signed)
 [facility]    CSN: [id number] Arrival date & time: 01/27/24  1116      History   Chief Complaint Chief Complaint  Patient presents with   Sore Throat    HPI Mary Baldwin is a 38 y.o. female.   Mary Baldwin is a 38 y.o. female presenting for chief complaint of Sore Throat and fatigue that started 2 days ago.  Sore throat is worsened by swallowing. Denies fever, chills, nausea, vomiting, rash, cough, congestion, ear pain, and recent antibiotic or steroid use. No sick contacts with similar symptoms. Taking Tylenol  and ibuprofen  with some relief of sore throat.   Sore Throat    Past Medical History:  Diagnosis Date   Medical history non-contributory     Patient Active Problem List   Diagnosis Date Noted   Umbilical hernia without obstruction and without gangrene 12/08/2021   SVD 4/8 09/09/2020   Postpartum care following vaginal delivery 4/8 09/09/2020   Polyhydramnios 09/09/2020   Diastasis recti 09/09/2020   Excessive cerumen in both ear canals 03/17/2020    Past Surgical History:  Procedure Laterality Date   NO PAST SURGERIES      OB History     Gravida  3   Para  3   Term  2   Preterm  0   AB  0   Living  2      SAB  0   IAB  0   Ectopic  0   Multiple  0   Live Births  2            Home Medications    Prior to Admission medications   Medication Sig Start Date End Date Taking? Authorizing Provider  amoxicillin  (AMOXIL ) 500 MG capsule Take 1 capsule (500 mg total) by mouth 2 (two) times daily for 10 days. 01/27/24 02/06/24 Yes StanhopeDorna HERO, FNP  fluconazole  (DIFLUCAN ) 150 MG tablet Take 1 tablet (150 mg total) by mouth every 3 (three) days. 01/27/24  Yes Kamarian Sahakian, Dorna HERO, FNP    Family History Family History  Problem Relation Age of Onset   Healthy Mother     Social History Social History   Tobacco Use   Smoking status: Never   Smokeless tobacco: Never  Vaping Use   Vaping status:  Never Used  Substance Use Topics   Alcohol use: Not Currently   Drug use: Never     Allergies   Patient has no known allergies.   Review of Systems Review of Systems Per HPI  Physical Exam Triage Vital Signs ED Triage Vitals  Encounter Vitals Group     BP 01/27/24 1216 127/77     Girls Systolic BP Percentile --      Girls Diastolic BP Percentile --      Boys Systolic BP Percentile --      Boys Diastolic BP Percentile --      Pulse Rate 01/27/24 1216 94     Resp 01/27/24 1216 16     Temp 01/27/24 1216 98.3 F (36.8 C)     Temp Source 01/27/24 1216 Oral     SpO2 01/27/24 1216 98 %     Weight --      Height --      Head Circumference --      Peak Flow --      Pain Score 01/27/24 1218 8     Pain Loc --      Pain Education --  Exclude from Growth Chart --    No data found.  Updated Vital Signs BP 127/77 (BP Location: Right Arm)   Pulse 94   Temp 98.3 F (36.8 C) (Oral)   Resp 16   LMP 01/24/2024 (Exact Date)   SpO2 98%   Breastfeeding No   Visual Acuity Right Eye Distance:   Left Eye Distance:   Bilateral Distance:    Right Eye Near:   Left Eye Near:    Bilateral Near:     Physical Exam Vitals and nursing note reviewed.  Constitutional:      Appearance: She is not ill-appearing or toxic-appearing.  HENT:     Head: Normocephalic and atraumatic.     Right Ear: Hearing, tympanic membrane, ear canal and external ear normal.     Left Ear: Hearing, tympanic membrane, ear canal and external ear normal.     Nose: Nose normal.     Mouth/Throat:     Lips: Pink.     Mouth: Mucous membranes are moist. No injury or oral lesions.     Dentition: Normal dentition.     Tongue: No lesions.     Pharynx: Uvula midline. Pharyngeal swelling and posterior oropharyngeal erythema present. No oropharyngeal exudate, uvula swelling or postnasal drip.     Tonsils: No tonsillar exudate or tonsillar abscesses. 2+ on the right. 2+ on the left.  Eyes:     General: Lids are  normal. Vision grossly intact. Gaze aligned appropriately.     Extraocular Movements: Extraocular movements intact.     Conjunctiva/sclera: Conjunctivae normal.  Neck:     Trachea: Trachea and phonation normal.  Pulmonary:     Effort: Pulmonary effort is normal.  Musculoskeletal:     Cervical back: Neck supple.  Lymphadenopathy:     Cervical: No cervical adenopathy.  Skin:    General: Skin is warm and dry.     Capillary Refill: Capillary refill takes less than 2 seconds.     Findings: No rash.  Neurological:     General: No focal deficit present.     Mental Status: She is alert and oriented to person, place, and time. Mental status is at baseline.     Cranial Nerves: No dysarthria or facial asymmetry.  Psychiatric:        Mood and Affect: Mood normal.        Speech: Speech normal.        Behavior: Behavior normal.        Thought Content: Thought content normal.        Judgment: Judgment normal.      UC Treatments / Results  Labs (all labs ordered are listed, but only abnormal results are displayed) Labs Reviewed  POCT RAPID STREP A (OFFICE) - Abnormal; Notable for the following components:      Result Value   Rapid Strep A Screen Positive (*)    All other components within normal limits    EKG   Radiology No results found.  Procedures Procedures (including critical care time)  Medications Ordered in UC Medications - No data to display  Initial Impression / Assessment and Plan / UC Course  I have reviewed the triage vital signs and the nursing notes.  Pertinent labs & imaging results that were available during my care of the patient were reviewed by me and considered in my medical decision making (see chart for details).   1.  Streptococcal sore throat, sore throat, antibiotic induced yeast infection POC strep testing is positive.  Amoxicillin  sent to pharmacy to be taken as prescribed.  OTC medications like tylenol /ibuprofen  as needed for throat pain and  fever/chills.   Advised to change toothbrush after 2 to 3 days of antibiotic use to prevent reinfection.   Salt water gargles and warm tea with honey may be used as needed to soothe sore throat.   Counseled patient on potential for adverse effects with medications prescribed/recommended today, strict ER and return-to-clinic precautions discussed, patient verbalized understanding.    Final Clinical Impressions(s) / UC Diagnoses   Final diagnoses:  Sore throat  Streptococcal sore throat  Antibiotic-induced yeast infection     Discharge Instructions      You have strep throat.  - Take the prescribed antibiotic as directed for the next 10 days. - Take ibuprofen  600 mg every 6 hours as needed for throat pain and fever.  - Change your toothbrush after 2 to 3 days of antibiotic use to prevent reinfection. - You may also gargle with salt water and drink warm tea with honey to soothe your throat.   If you develop any new or worsening symptoms or if your symptoms do not start to improve, please return here or follow-up with your primary care provider. If your symptoms are severe, please go to the emergency room.    ED Prescriptions     Medication Sig Dispense Auth. Provider   amoxicillin  (AMOXIL ) 500 MG capsule Take 1 capsule (500 mg total) by mouth 2 (two) times daily for 10 days. 20 capsule Enedelia Dorna HERO, FNP   fluconazole  (DIFLUCAN ) 150 MG tablet Take 1 tablet (150 mg total) by mouth every 3 (three) days. 2 tablet Enedelia Dorna HERO, FNP      PDMP not reviewed this encounter.   Enedelia Dorna HERO, [place] 01/27/24 1257

## 2024-01-28 ENCOUNTER — Ambulatory Visit (HOSPITAL_COMMUNITY): Payer: Self-pay

## 2024-02-24 ENCOUNTER — Ambulatory Visit: Payer: Self-pay | Admitting: Physician Assistant

## 2024-04-28 ENCOUNTER — Ambulatory Visit: Admitting: Podiatry

## 2024-05-04 ENCOUNTER — Encounter: Payer: Self-pay | Admitting: Physician Assistant

## 2024-05-04 ENCOUNTER — Ambulatory Visit (INDEPENDENT_AMBULATORY_CARE_PROVIDER_SITE_OTHER): Admitting: Physician Assistant

## 2024-05-04 VITALS — BP 106/62 | HR 80 | Temp 97.9°F | Ht 65.0 in | Wt 140.6 lb

## 2024-05-04 DIAGNOSIS — F909 Attention-deficit hyperactivity disorder, unspecified type: Secondary | ICD-10-CM | POA: Insufficient documentation

## 2024-05-04 DIAGNOSIS — R051 Acute cough: Secondary | ICD-10-CM | POA: Diagnosis not present

## 2024-05-04 DIAGNOSIS — M216X1 Other acquired deformities of right foot: Secondary | ICD-10-CM | POA: Insufficient documentation

## 2024-05-04 DIAGNOSIS — M216X2 Other acquired deformities of left foot: Secondary | ICD-10-CM | POA: Insufficient documentation

## 2024-05-04 MED ORDER — BENZONATATE 100 MG PO CAPS: 100.0000 mg | ORAL_CAPSULE | Freq: Three times a day (TID) | ORAL | 0 refills | Status: AC | PRN

## 2024-05-04 MED ORDER — AZITHROMYCIN 250 MG PO TABS: ORAL_TABLET | ORAL | 0 refills | Status: AC

## 2024-05-04 NOTE — Patient Instructions (Signed)
 Welcome to [facility]! It was a pleasure meeting you today.   PLEASE NOTE:  If you had any LAB tests please let us know if you have not heard back within a few days. You may see your results on MyChart before we have a chance to review them but we will give you a call once they are reviewed by [place]. If we ordered any REFERRALS today, please let us know if you have not heard from their office within the next two weeks. Let us know through MyChart if you are needing REFILLS, or have your pharmacy send [place] the request. You can also use MyChart to communicate with me or any office staff.  Please try these tips to maintain a healthy lifestyle:  Eat most of your calories during the day when you are active. Eliminate processed foods including packaged sweets (pies, cakes, cookies), reduce intake of potatoes, white bread, white pasta, and white rice. Look for whole grain options, oat flour or almond flour.  Each meal should contain half fruits/vegetables, one quarter protein, and one quarter carbs (no bigger than a computer mouse).  Cut down on sweet beverages. This includes juice, soda, and sweet tea. Also watch fruit intake, though this is a healthier sweet option, it still contains natural sugar! Limit to 3 servings daily.  Drink at least 1 glass of water with each meal and aim for at least 8 glasses (64 ounces) per day.  Exercise at least 150 minutes every week to the best of your ability.    Take Care,  Audel Coakley, PA-C

## 2024-05-04 NOTE — Progress Notes (Signed)
 Patient ID: Mary Baldwin, female    DOB: 1986/03/31, 38 y.o.   MRN: [id number]   Assessment & Plan:  Acute cough  Adult ADHD  Other orders -     Azithromycin; Take 2 tablets on day 1, then 1 tablet daily on days 2 through 5  Dispense: 6 tablet; Refill: 0 -     Benzonatate ; Take 1 capsule (100 mg total) by mouth 3 (three) times daily as needed for cough.  Dispense: 21 capsule; Refill: 0     Pleasant new patient establishing primary care.  Adult ADHD -Managed by Mary Baldwin, stable on Vyvanse 20 mg daily  Acute cough and congestion - Greater than 10 days of symptoms with no improvement.  Started on antibiotics at this time (Zpak and Tessalon  Perles.)  Continue teas with honey.      Return in about 6 months (around 11/02/2024) for physical (& fasting labs) .    Subjective:    Chief Complaint  Patient presents with   New Patient (Initial Visit)    New pt in office to est care with PCP; OBGYN has been managing pt care for years; pt has had cough for over a week that won't go away, kids also been having same symptoms.    HPI This is a 38 year old female presenting for establishment of primary care.  She is married.  She has 2 children, a 62-year-old daughter and a 57-year-old son.  She works full-time.  She was raised by a single mother and does not know much of her family history.  Patient reports that she has no major medical issues.  She takes Vyvanse 20 mg daily for adult ADHD.  She follows with [facility], Mary Baldwin, for this medication.  She has been doing well with that.  She had a hernia repair and diastases rectus abdominous repair December 2023.  Otherwise no major surgeries.  She follows with [facility].  She recently has had a cough and congestion for the last 2 weeks.  The cough is productive and does not seem to be improving.  Her children have been sick with similar symptoms.   Past Medical History:  Diagnosis Date   Diastasis of rectus  abdominis    Medical history non-contributory     Past Surgical History:  Procedure Laterality Date   HERNIA REPAIR  05/04/2022   [facility] - Dr. Veria and Dr. Katrinka   NO PAST SURGERIES     WISDOM TOOTH EXTRACTION Bilateral     Family History  Problem Relation Age of Onset   Skin cancer Mother    Anxiety disorder Mother     Social History   Tobacco Use   Smoking status: Never   Smokeless tobacco: Never  Vaping Use   Vaping status: Never Used  Substance Use Topics   Alcohol use: Yes    Alcohol/week: 6.0 standard drinks of alcohol    Types: 1 Glasses of wine, 5 Cans of beer per week   Drug use: Yes    Types: Marijuana     No Known Allergies  Review of Systems NEGATIVE UNLESS OTHERWISE INDICATED IN HPI      Objective:     BP 106/62 (BP Location: Left Arm, Patient Position: Sitting, Cuff Size: Normal)   Pulse 80   Temp 97.9 F (36.6 C) (Temporal)   Ht 5' 5 (1.651 m)   Wt 140 lb 9.6 oz (63.8 kg)   LMP 04/30/2024 (Exact Date)   SpO2 96%  BMI 23.40 kg/m   Wt Readings from Last 3 Encounters:  05/04/24 140 lb 9.6 oz (63.8 kg)  07/18/23 175 lb 3.2 oz (79.5 kg)  09/09/20 175 lb 3.2 oz (79.5 kg)    BP Readings from Last 3 Encounters:  05/04/24 106/62  01/27/24 127/77  08/06/22 (!) 107/54     Physical Exam Vitals and nursing note reviewed.  Constitutional:      General: She is not in acute distress.    Appearance: Normal appearance. She is not ill-appearing.  HENT:     Head: Normocephalic.     Right Ear: Tympanic membrane, ear canal and external ear normal.     Left Ear: Tympanic membrane, ear canal and external ear normal.     Nose: Congestion present.     Mouth/Throat:     Mouth: Mucous membranes are moist.     Pharynx: No oropharyngeal exudate or posterior oropharyngeal erythema.  Eyes:     Extraocular Movements: Extraocular movements intact.     Conjunctiva/sclera: Conjunctivae normal.     Pupils: Pupils are equal, round, and reactive to  light.  Cardiovascular:     Rate and Rhythm: Normal rate and regular rhythm.     Pulses: Normal pulses.     Heart sounds: Normal heart sounds. No murmur heard. Pulmonary:     Effort: Pulmonary effort is normal. No respiratory distress.     Breath sounds: Normal breath sounds. No wheezing.  Musculoskeletal:     Cervical back: Normal range of motion.  Skin:    General: Skin is warm.  Neurological:     Mental Status: She is alert and oriented to person, place, and time.  Psychiatric:        Mood and Affect: Mood normal.        Behavior: Behavior normal.             Mary Baldwin M Mystic Labo, PA-C

## 2024-10-19 ENCOUNTER — Encounter: Admitting: Physician Assistant

## 2024-11-02 ENCOUNTER — Encounter: Admitting: Physician Assistant
